# Patient Record
Sex: Female | Born: 1975 | Race: White | Hispanic: No | Marital: Single | Smoking: Former smoker
Health system: Southern US, Community
[De-identification: ages and names within clinical notes are randomized; demographics above are authoritative.]

## PROBLEM LIST (undated history)

## (undated) DIAGNOSIS — E559 Vitamin D deficiency, unspecified: Secondary | ICD-10-CM

## (undated) DIAGNOSIS — M329 Systemic lupus erythematosus, unspecified: Secondary | ICD-10-CM

## (undated) DIAGNOSIS — F32A Depression, unspecified: Secondary | ICD-10-CM

## (undated) DIAGNOSIS — D649 Anemia, unspecified: Secondary | ICD-10-CM

## (undated) DIAGNOSIS — L719 Rosacea, unspecified: Secondary | ICD-10-CM

## (undated) DIAGNOSIS — IMO0002 Reserved for concepts with insufficient information to code with codable children: Secondary | ICD-10-CM

## (undated) DIAGNOSIS — K219 Gastro-esophageal reflux disease without esophagitis: Secondary | ICD-10-CM

## (undated) DIAGNOSIS — M797 Fibromyalgia: Secondary | ICD-10-CM

## (undated) DIAGNOSIS — F419 Anxiety disorder, unspecified: Secondary | ICD-10-CM

## (undated) DIAGNOSIS — G4700 Insomnia, unspecified: Secondary | ICD-10-CM

## (undated) HISTORY — DX: Depression, unspecified: F32.A

## (undated) HISTORY — DX: Fibromyalgia: M79.7

## (undated) HISTORY — DX: Hereditary hemochromatosis: E83.110

## (undated) HISTORY — DX: Systemic lupus erythematosus, unspecified: M32.9

## (undated) HISTORY — DX: Insomnia, unspecified: G47.00

## (undated) HISTORY — DX: Reserved for concepts with insufficient information to code with codable children: IMO0002

## (undated) HISTORY — DX: Rosacea, unspecified: L71.9

## (undated) HISTORY — DX: Vitamin D deficiency, unspecified: E55.9

## (undated) HISTORY — DX: Anemia, unspecified: D64.9

## (undated) HISTORY — DX: Gastro-esophageal reflux disease without esophagitis: K21.9

## (undated) HISTORY — DX: Anxiety disorder, unspecified: F41.9

---
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
## 2006-12-17 HISTORY — PX: CHOLECYSTECTOMY: SHX55

## 2015-12-18 HISTORY — PX: LAPAROSCOPIC GASTRIC SLEEVE RESECTION: SHX5895

## 2016-07-18 DIAGNOSIS — Z9989 Dependence on other enabling machines and devices: Secondary | ICD-10-CM | POA: Insufficient documentation

## 2016-07-18 DIAGNOSIS — G4733 Obstructive sleep apnea (adult) (pediatric): Secondary | ICD-10-CM

## 2016-07-18 HISTORY — DX: Obstructive sleep apnea (adult) (pediatric): G47.33

## 2017-04-22 DIAGNOSIS — M7662 Achilles tendinitis, left leg: Secondary | ICD-10-CM | POA: Insufficient documentation

## 2017-04-22 HISTORY — DX: Achilles tendinitis, left leg: M76.62

## 2020-12-22 ENCOUNTER — Telehealth: Payer: Self-pay | Admitting: Oncology

## 2020-12-22 NOTE — Telephone Encounter (Signed)
Patient referred by Dr Philemon Kingdom for Hereditary Hemochromatosis.  Appt made for 01/09/2021 Labs 9:30 am - Consult 10:00 am

## 2021-01-06 ENCOUNTER — Other Ambulatory Visit: Payer: Self-pay | Admitting: Oncology

## 2021-01-06 DIAGNOSIS — M797 Fibromyalgia: Secondary | ICD-10-CM | POA: Insufficient documentation

## 2021-01-06 DIAGNOSIS — G4700 Insomnia, unspecified: Secondary | ICD-10-CM | POA: Insufficient documentation

## 2021-01-06 DIAGNOSIS — F411 Generalized anxiety disorder: Secondary | ICD-10-CM | POA: Insufficient documentation

## 2021-01-06 DIAGNOSIS — K219 Gastro-esophageal reflux disease without esophagitis: Secondary | ICD-10-CM | POA: Insufficient documentation

## 2021-01-06 DIAGNOSIS — L719 Rosacea, unspecified: Secondary | ICD-10-CM

## 2021-01-06 DIAGNOSIS — E559 Vitamin D deficiency, unspecified: Secondary | ICD-10-CM | POA: Insufficient documentation

## 2021-01-06 DIAGNOSIS — Z903 Acquired absence of stomach [part of]: Secondary | ICD-10-CM

## 2021-01-06 DIAGNOSIS — M329 Systemic lupus erythematosus, unspecified: Secondary | ICD-10-CM

## 2021-01-06 NOTE — Progress Notes (Signed)
[facility]  [address] [place],  [place]  [address] [phone]  Clinic Day:  01/09/2021  Referring physician: Philemon Kingdom, MD  HISTORY OF PRESENT ILLNESS:  The patient is a 45 y.o. female  who I was asked to consult upon for having hemochromatosis.  Recent hemochromatosis testing showed her to have the C282Y and H63D mutations.    According to the patient, she had been feeling weak for the past few months.  Iron parameters were collected as it was thought she may be iron deficient.  However, her iron saturation levels came back elevated at 70.8%.  This led her to undergoing hemochromatosis testing, which revealed both C282Y and H63D mutations.  She denies there being a family history of hemochromatosis.  The patient claims to still have menstrual cycles, but they are very light, which she attributes to having an IUD for 4 years.  She denies having other significant changes in her health over these past few months.  PAST MEDICAL HISTORY:   Past Medical History:  Diagnosis Date  . Anemia   . Anxiety   . Depression   . Fibromyalgia   . GERD (gastroesophageal reflux disease)   . Hereditary hemochromatosis (HCC)   . Insomnia   . Rosacea   . Systemic lupus erythematosus (HCC)   . Vitamin D deficiency   COVID Depression  PAST SURGICAL HISTORY:   Gastric sleeve surgery, dilatation and curettage, cholecystectomy, vulvar ablation  CURRENT MEDICATIONS:   Current Outpatient Medications  Medication Sig Dispense Refill  . ALPRAZolam (XANAX) 0.25 MG tablet Take 0.25 mg by mouth 3 (three) times daily as needed for anxiety.    . DEXILANT 60 MG capsule Take 1 capsule by mouth daily.    . DULoxetine (CYMBALTA) 20 MG capsule Take 20 mg by mouth daily.    Marland Kitchen EPINEPHrine 0.3 mg/0.3 mL IJ SOAJ injection Inject 0.3 mg into the muscle as needed for anaphylaxis.    Marland Kitchen ondansetron (ZOFRAN-ODT) 4 MG disintegrating tablet Take 4 mg by mouth every 8 (eight) hours  as needed.     No current facility-administered medications for this visit.    ALLERGIES:   Allergies  Allergen Reactions  . Fluogen [Influenza Virus Vaccine] Shortness Of Breath and Rash  . Macrobid [Nitrofurantoin] Shortness Of Breath  . Pyridium [Phenazopyridine] Shortness Of Breath  . Naltrexone Hives  . Pylera [Bis Subcit-Metronid-Tetracyc] Itching    FAMILY HISTORY:   Family History  Problem Relation Age of Onset  . Hypertension Mother   . Heart murmur Mother   . Celiac disease Mother   . Hypertension Father   . Heart attack Father   . Transient ischemic attack Father   . Transient ischemic attack Maternal Grandmother   . CVA Paternal Grandmother   . Hypertension Paternal Grandmother   . Heart attack Paternal Grandfather   . Hypertension Paternal Grandfather   . Thyroid disease Sister   . Hypertension Brother    A paternal aunt died from leukemia.  She had 2 maternal uncles died from lung cancer.  SOCIAL HISTORY:  The patient was born and raised in [place].  She lives in the [place] community.  She is divorced, with 4 children and 1 grandchild.  She has been radiology technician for 14 years.  She did smoke a pack of cigarettes daily for 6 years before quitting 20 years ago.  She drinks alcohol on very rare occasions.  REVIEW OF SYSTEMS:  Review of Systems  Constitutional: Negative for  fatigue and fever.  HENT:   Negative for hearing loss and sore throat.   Eyes: Positive for eye problems (suboptimal vision).  Respiratory: Negative for chest tightness, cough and hemoptysis.   Cardiovascular: Negative for chest pain and palpitations.  Gastrointestinal: Negative for abdominal distention, abdominal pain, blood in stool, constipation, diarrhea, nausea and vomiting.  Endocrine: Negative for hot flashes.  Genitourinary: Negative for difficulty urinating, dysuria, frequency, hematuria and nocturia.   Musculoskeletal: Positive for arthralgias and myalgias.  Negative for back pain and gait problem.  Skin: Negative.  Negative for itching and rash.  Neurological: Negative.  Negative for dizziness, extremity weakness, gait problem, headaches, light-headedness and numbness.  Hematological: Negative.   Psychiatric/Behavioral: Positive for depression. Negative for suicidal ideas. The patient is nervous/anxious.      PHYSICAL EXAM:  There were no vitals taken for this visit. Wt Readings from Last 3 Encounters:  01/09/21 176 lb 9.6 oz (80.1 kg)   There is no height or weight on file to calculate BMI.  Vital signs include a weight of 177 lb, temperature 98.1, pulse 65, respirations 16, blood pressure 126/66, O2sat 99% Performance status (ECOG): 0 Physical Exam Constitutional:      Appearance: Normal appearance. She is not ill-appearing.  HENT:     Mouth/Throat:     Mouth: Mucous membranes are moist.     Pharynx: Oropharynx is clear. No oropharyngeal exudate or posterior oropharyngeal erythema.  Cardiovascular:     Rate and Rhythm: Normal rate and regular rhythm.     Heart sounds: No murmur heard. No friction rub. No gallop.   Pulmonary:     Effort: Pulmonary effort is normal. No respiratory distress.     Breath sounds: Normal breath sounds. No wheezing, rhonchi or rales.  Chest:  Breasts:     Right: No axillary adenopathy or supraclavicular adenopathy.     Left: No axillary adenopathy or supraclavicular adenopathy.    Abdominal:     General: Bowel sounds are normal. There is no distension.     Palpations: Abdomen is soft. There is no mass.     Tenderness: There is no abdominal tenderness.  Musculoskeletal:        General: No swelling.     Right lower leg: No edema.     Left lower leg: No edema.  Lymphadenopathy:     Cervical: No cervical adenopathy.     Upper Body:     Right upper body: No supraclavicular or axillary adenopathy.     Left upper body: No supraclavicular or axillary adenopathy.     Lower Body: No right inguinal  adenopathy. No left inguinal adenopathy.  Skin:    General: Skin is warm.     Coloration: Skin is not jaundiced.     Findings: No lesion or rash.  Neurological:     General: No focal deficit present.     Mental Status: She is alert and oriented to person, place, and time. Mental status is at baseline.     Cranial Nerves: Cranial nerves are intact.  Psychiatric:        Mood and Affect: Mood normal.        Behavior: Behavior normal.        Thought Content: Thought content normal.    LABS:   CBC Latest Ref Rng & Units 01/09/2021  WBC - 6.1  Hemoglobin 12.0 - 16.0 13.2  Hematocrit 36 - 46 39  Platelets 150 - 399 280   CMP Latest Ref Rng & Units 01/09/2021  BUN 4 - 21 16  Creatinine 0.5 - 1.1 0.8  Sodium 137 - 147 137  Potassium 3.4 - 5.3 3.8  Chloride 99 - 108 105  CO2 13 - 22 28(A)  Calcium 8.7 - 10.7 8.7  Alkaline Phos 25 - 125 37  AST 13 - 35 37(A)  ALT 7 - 35 40(A)    Ref. Range 01/09/2021 00:00  Iron Unknown 182.0  TIBC Unknown 199  %SAT Unknown 91.4  Ferritin Unknown 67.1    Lab Results  Component Value Date   TIBC 199 01/09/2021   FERRITIN 67.1 01/09/2021   IRONPCTSAT 91.4 01/09/2021    ASSESSMENT & PLAN:  A 45 year old woman who I was asked to consult upon for hemochromatosis (C282Y/H63D).  Based upon her labs today,   although her ferritin is below 100, her other iron parameters are very elevated.  As she does not have 2 C282Y mutations, the likelihood of her having severe iron overload and secondary organ damage in the future is low.  However, I do want to phlebotomize her this week and in 2 months to get her iron parameters to a more suitable level.  I have recommended that her 1st degree relatives also have their iron parameters checked.  If elevated, they also need to screened for hemochromatosis.  She understands her children will at least be carriers.  The question is are they also fully affected to where they may need to be considered for phlebobotomies in  their future.  Otherwise, I will see this patient back in 4 months for repeat clinical assessment.  The patient understands all the plans discussed today and is in agreement with them.  I do appreciate Dr Philemon Kingdom for his new consult.   Rand Boller Kirby Funk, MD

## 2021-01-09 ENCOUNTER — Inpatient Hospital Stay: Payer: Commercial Managed Care - PPO | Attending: Oncology | Admitting: Hematology and Oncology

## 2021-01-09 ENCOUNTER — Other Ambulatory Visit: Payer: Self-pay

## 2021-01-09 ENCOUNTER — Inpatient Hospital Stay (INDEPENDENT_AMBULATORY_CARE_PROVIDER_SITE_OTHER): Payer: Commercial Managed Care - PPO | Admitting: Oncology

## 2021-01-09 ENCOUNTER — Other Ambulatory Visit: Payer: Self-pay | Admitting: Oncology

## 2021-01-09 ENCOUNTER — Telehealth: Payer: Self-pay | Admitting: Oncology

## 2021-01-09 ENCOUNTER — Other Ambulatory Visit: Payer: Self-pay | Admitting: Hematology and Oncology

## 2021-01-09 LAB — BASIC METABOLIC PANEL
BUN: 16 (ref 4–21)
CO2: 28 — AB (ref 13–22)
Chloride: 105 (ref 99–108)
Creatinine: 0.8 (ref 0.5–1.1)
Glucose: 97
Potassium: 3.8 (ref 3.4–5.3)
Sodium: 137 (ref 137–147)

## 2021-01-09 LAB — CBC AND DIFFERENTIAL
HCT: 39 (ref 36–46)
Hemoglobin: 13.2 (ref 12.0–16.0)
Neutrophils Absolute: 3.66
Platelets: 280 (ref 150–399)
WBC: 6.1

## 2021-01-09 LAB — HEPATIC FUNCTION PANEL
ALT: 40 — AB (ref 7–35)
AST: 37 — AB (ref 13–35)
Alkaline Phosphatase: 37 (ref 25–125)
Bilirubin, Total: 0.6

## 2021-01-09 LAB — COMPREHENSIVE METABOLIC PANEL
Albumin: 3.6 (ref 3.5–5.0)
Calcium: 8.7 (ref 8.7–10.7)

## 2021-01-09 LAB — CBC: RBC: 4.03 (ref 3.87–5.11)

## 2021-01-09 LAB — IRON,TIBC AND FERRITIN PANEL
%SAT: 91.4
Ferritin: 67.1
Iron: 182
TIBC: 199

## 2021-01-09 NOTE — Telephone Encounter (Signed)
Per 1/24 LOS, patient scheduled for May Appt's.  Patient entered her Appt's in her phone

## 2021-01-10 ENCOUNTER — Encounter: Payer: Self-pay | Admitting: Oncology

## 2021-01-10 ENCOUNTER — Telehealth: Payer: Self-pay | Admitting: Oncology

## 2021-01-10 NOTE — Telephone Encounter (Signed)
Patient scheduled for Jan, Mar, May Phlebotomy Therapy's - Also scheduled for May Labs, Follow Up  Gave Appt's over the phone

## 2021-01-10 NOTE — Progress Notes (Unsigned)
Per [place] @ [facility] [phone] no PA required for Phlebotomy [address]  Ref#Vivien B 01/10/2021 3;01 PM

## 2021-01-13 ENCOUNTER — Inpatient Hospital Stay: Payer: Commercial Managed Care - PPO

## 2021-01-13 ENCOUNTER — Other Ambulatory Visit: Payer: Self-pay

## 2021-01-13 NOTE — Progress Notes (Signed)
Casimiro Needle presents today for phlebotomy per MD orders. Phlebotomy procedure started at 1549 and ended at 1603. 485 grams removed. Patient observed for 30 minutes after procedure without any incident. Patient tolerated procedure well. IV needle removed intact. Pt drank po water and ginger ale.  No strenuous activities advised.

## 2021-01-13 NOTE — Patient Instructions (Signed)
Therapeutic Phlebotomy Therapeutic phlebotomy is the planned removal of blood from a person's body for the purpose of treating a medical condition. The procedure is similar to donating blood. Usually, about a pint (470 mL, or 0.47 L) of blood is removed. The average adult has 9-12 pints (4.3-5.7 L) of blood in the body. Therapeutic phlebotomy may be used to treat the following medical conditions:  Hemochromatosis. This is a condition in which the blood contains too much iron.  Polycythemia vera. This is a condition in which the blood contains too many red blood cells.  Porphyria cutanea tarda. This is a disease in which an important part of hemoglobin is not made properly. It results in the buildup of abnormal amounts of porphyrins in the body.  Sickle cell disease. This is a condition in which the red blood cells form an abnormal crescent shape rather than a round shape. Tell a health care provider about:  Any allergies you have.  All medicines you are taking, including vitamins, herbs, eye drops, creams, and over-the-counter medicines.  Any problems you or family members have had with anesthetic medicines.  Any blood disorders you have.  Any surgeries you have had.  Any medical conditions you have.  Whether you are pregnant or may be pregnant. What are the risks? Generally, this is a safe procedure. However, problems may occur, including:  Nausea or light-headedness.  Low blood pressure (hypotension).  Soreness, bleeding, swelling, or bruising at the needle insertion site.  Infection. What happens before the procedure?  Follow instructions from your health care provider about eating or drinking restrictions.  Ask your health care provider about: ? Changing or stopping your regular medicines. This is especially important if you are taking diabetes medicines or blood thinners (anticoagulants). ? Taking medicines such as aspirin and ibuprofen. These medicines can thin your  blood. Do not take these medicines unless your health care provider tells you to take them. ? Taking over-the-counter medicines, vitamins, herbs, and supplements.  Wear clothing with sleeves that can be raised above the elbow.  Plan to have someone take you home from the hospital or clinic.  You may have a blood sample taken.  Your blood pressure, pulse rate, and breathing rate will be measured. What happens during the procedure?  To lower your risk of infection: ? Your health care team will wash or sanitize their hands. ? Your skin will be cleaned with an antiseptic.  You may be given a medicine to numb the area (local anesthetic).  A tourniquet will be placed on your arm.  A needle will be inserted into one of your veins.  Tubing and a collection bag will be attached to that needle.  Blood will flow through the needle and tubing into the collection bag.  The collection bag will be placed lower than your arm to allow gravity to help the flow of blood into the bag.  You may be asked to open and close your hand slowly and continually during the entire collection.  After the specified amount of blood has been removed from your body, the collection bag and tubing will be clamped.  The needle will be removed from your vein.  Pressure will be held on the site of the needle insertion to stop the bleeding.  A bandage (dressing) will be placed over the needle insertion site. The procedure may vary among health care providers and hospitals.   What happens after the procedure?  Your blood pressure, pulse rate, and breathing rate will   be measured after the procedure.  You will be encouraged to drink fluids.  Your recovery will be assessed and monitored.  You can return to your normal activities as told by your health care provider. Summary  Therapeutic phlebotomy is the planned removal of blood from a person's body for the purpose of treating a medical condition.  Therapeutic  phlebotomy may be used to treat hemochromatosis, polycythemia vera, porphyria cutanea tarda, or sickle cell disease.  In the procedure, a needle is inserted and about a pint (470 mL, or 0.47 L) of blood is removed. The average adult has 9-12 pints (4.3-5.7 L) of blood in the body.  This is generally a safe procedure, but it can sometimes cause problems such as nausea, light-headedness, or low blood pressure (hypotension). This information is not intended to replace advice given to you by your health care provider. Make sure you discuss any questions you have with your health care provider. Document Revised: 12/19/2017 Document Reviewed: 12/19/2017 Elsevier Patient Education  2021 Elsevier Inc.  

## 2021-03-13 ENCOUNTER — Inpatient Hospital Stay: Payer: Commercial Managed Care - PPO

## 2021-03-13 ENCOUNTER — Telehealth: Payer: Self-pay

## 2021-03-13 NOTE — Telephone Encounter (Signed)
PT WILL BE GIVING BLOOD THRU [facility], DOES NOT WANT A PHLEBOTOMY TODAY.

## 2021-05-05 NOTE — Progress Notes (Signed)
[facility]  [address] [place],  [place]  [address] [phone]  Clinic Day:  05/09/2021  Referring physician: Philemon Kingdom, MD  This document serves as a record of services personally performed by Wendy Kirby Funk, MD. It was created on their behalf by Wendy Spencer, a trained medical scribe. The creation of this record is based on the scribe's personal observations and the provider's statements to them.  HISTORY OF PRESENT ILLNESS:  The patient is a 45 y.o. 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   She comes in today to reassess her iron parameters after being phlebotomized every 2 months over these past handful of months.  Since her last visit, she has been doing well.  She denies having any systemic symptoms which concern her for complications related to her underlying hemochromatosis.  PHYSICAL EXAM:  Blood pressure 112/72, pulse 62, temperature 98.4 F (36.9 C), resp. rate 16, height 5\' 4"  (1.626 m), weight 179 lb 1.6 oz (81.2 kg), SpO2 99 %. Wt Readings from Last 3 Encounters:  05/09/21 179 lb 1.6 oz (81.2 kg)  01/13/21 177 lb 8 oz (80.5 kg)  01/09/21 176 lb 9.6 oz (80.1 kg)   Body mass index is 30.74 kg/m.   Performance status (ECOG): 0 Physical Exam Constitutional:      Appearance: Normal appearance. She is not ill-appearing.  HENT:     Mouth/Throat:     Mouth: Mucous membranes are moist.     Pharynx: Oropharynx is clear. No oropharyngeal exudate or posterior oropharyngeal erythema.  Cardiovascular:     Rate and Rhythm: Normal rate and regular rhythm.     Heart sounds: No murmur heard. No friction rub. No gallop.   Pulmonary:     Effort: Pulmonary effort is normal. No respiratory distress.     Breath sounds: Normal breath sounds. No wheezing, rhonchi or rales.  Chest:  Breasts:     Right: No axillary adenopathy or supraclavicular adenopathy.     Left: No axillary adenopathy or supraclavicular adenopathy.     Abdominal:     General: Bowel sounds are normal. There is no distension.     Palpations: Abdomen is soft. There is no mass.     Tenderness: There is no abdominal tenderness.  Musculoskeletal:        General: No swelling.     Right lower leg: No edema.     Left lower leg: No edema.  Lymphadenopathy:     Cervical: No cervical adenopathy.     Upper Body:     Right upper body: No supraclavicular or axillary adenopathy.     Left upper body: No supraclavicular or axillary adenopathy.     Lower Body: No right inguinal adenopathy. No left inguinal adenopathy.  Skin:    General: Skin is warm.     Coloration: Skin is not jaundiced.     Findings: No lesion or rash.  Neurological:     General: No focal deficit present.     Mental Status: She is alert and oriented to person, place, and time. Mental status is at baseline.     Cranial Nerves: Cranial nerves are intact.  Psychiatric:        Mood and Affect: Mood normal.        Behavior: Behavior normal.        Thought Content: Thought content normal.    LABS:   CBC Latest Ref Rng & Units 05/08/2021 01/09/2021  WBC - 7.9 6.1  Hemoglobin 12.0 - 16.0 13.4  13.2  Hematocrit 36 - 46 41 39  Platelets 150 - 399 262 280   CMP Latest Ref Rng & Units 05/08/2021 01/09/2021  BUN 4 - 21 16 16   Creatinine 0.5 - 1.1 0.8 0.8  Sodium 137 - 147 136(A) 137  Potassium 3.4 - 5.3 3.7 3.8  Chloride 99 - 108 106 105  CO2 13 - 22 26(A) 28(A)  Calcium 8.7 - 10.7 8.6(A) 8.7  Alkaline Phos 25 - 125 39 37  AST 13 - 35 30 37(A)  ALT 7 - 35 34 40(A)    Ref. Range 01/09/2021 00:00 05/08/2021 8:47  Iron Unknown 182.0 130  TIBC Unknown 199 288  %SAT Unknown 91.4 45  Ferritin Unknown 67.1 26    ASSESSMENT & PLAN:  A 45 year old woman with hemochromatosis (C282Y/H63D).  Based upon her labs today, I am pleased as her ferritin is well below 100.  Her other iron parameters have also improved.   Based upon her current levels, she does not need to be phlebotomized  for these next few months.  I will see this patient back in 4 months for repeat clinical assessment.  The patient understands all the plans discussed today and is in agreement with them.   I, 48, am acting as scribe for Wendy Deer, MD    I have reviewed this report as typed by the medical scribe, and it is complete and accurate.  Wendy Weston Settle, MD

## 2021-05-08 ENCOUNTER — Other Ambulatory Visit: Payer: Self-pay

## 2021-05-08 ENCOUNTER — Encounter: Payer: Self-pay | Admitting: Hematology and Oncology

## 2021-05-08 ENCOUNTER — Inpatient Hospital Stay: Payer: Commercial Managed Care - PPO | Attending: Oncology

## 2021-05-08 LAB — CBC AND DIFFERENTIAL
HCT: 41 (ref 36–46)
Hemoglobin: 13.4 (ref 12.0–16.0)
Neutrophils Absolute: 5.29
Platelets: 262 (ref 150–399)
WBC: 7.9

## 2021-05-08 LAB — HEPATIC FUNCTION PANEL
ALT: 34 (ref 7–35)
AST: 30 (ref 13–35)
Alkaline Phosphatase: 39 (ref 25–125)
Bilirubin, Total: 0.5

## 2021-05-08 LAB — BASIC METABOLIC PANEL
BUN: 16 (ref 4–21)
CO2: 26 — AB (ref 13–22)
Chloride: 106 (ref 99–108)
Creatinine: 0.8 (ref 0.5–1.1)
Glucose: 102
Potassium: 3.7 (ref 3.4–5.3)
Sodium: 136 — AB (ref 137–147)

## 2021-05-08 LAB — IRON AND TIBC
Iron: 130 ug/dL (ref 28–170)
Saturation Ratios: 45 % — ABNORMAL HIGH (ref 10.4–31.8)
TIBC: 288 ug/dL (ref 250–450)
UIBC: 158 ug/dL

## 2021-05-08 LAB — COMPREHENSIVE METABOLIC PANEL
Albumin: 3.6 (ref 3.5–5.0)
Calcium: 8.6 — AB (ref 8.7–10.7)

## 2021-05-08 LAB — CBC: RBC: 4.24 (ref 3.87–5.11)

## 2021-05-08 LAB — FERRITIN: Ferritin: 26 ng/mL (ref 11–307)

## 2021-05-09 ENCOUNTER — Inpatient Hospital Stay (INDEPENDENT_AMBULATORY_CARE_PROVIDER_SITE_OTHER): Payer: Commercial Managed Care - PPO | Admitting: Oncology

## 2021-05-09 ENCOUNTER — Other Ambulatory Visit: Payer: Self-pay | Admitting: Oncology

## 2021-08-05 DIAGNOSIS — L239 Allergic contact dermatitis, unspecified cause: Secondary | ICD-10-CM

## 2021-08-05 HISTORY — DX: Allergic contact dermatitis, unspecified cause: L23.9

## 2021-08-18 ENCOUNTER — Encounter: Payer: Self-pay | Admitting: Oncology

## 2021-08-18 NOTE — Progress Notes (Signed)
[facility]  [address] [place],  [place]  [address] [phone]  Clinic Day:  08/28/2021  Referring physician: Philemon Kingdom, MD  This document serves as a record of services personally performed by Dequincy Kirby Funk, MD. It was created on their behalf by [facility], a trained medical scribe. The creation of this record is based on the scribe's personal observations and the provider's statements to them.  HISTORY OF PRESENT ILLNESS:  The patient is a 45 y.o. 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   She comes in today to reassess her iron parameters.  Since her last visit, she has been doing well.  She denies having any systemic symptoms which concern her for complications related to her underlying hemochromatosis.  PHYSICAL EXAM:  Blood pressure 100/65, pulse 84, temperature 98.5 F (36.9 C), resp. rate 16, height 5\' 4"  (1.626 m), weight 169 lb (76.7 kg), SpO2 99 %. Wt Readings from Last 3 Encounters:  08/28/21 169 lb (76.7 kg)  05/09/21 179 lb 1.6 oz (81.2 kg)  01/13/21 177 lb 8 oz (80.5 kg)   Body mass index is 29.01 kg/m.   Performance status (ECOG): 0 Physical Exam Constitutional:      Appearance: Normal appearance. She is not ill-appearing.  HENT:     Mouth/Throat:     Mouth: Mucous membranes are moist.     Pharynx: Oropharynx is clear. No oropharyngeal exudate or posterior oropharyngeal erythema.  Cardiovascular:     Rate and Rhythm: Normal rate and regular rhythm.     Heart sounds: No murmur heard.   No friction rub. No gallop.  Pulmonary:     Effort: Pulmonary effort is normal. No respiratory distress.     Breath sounds: Normal breath sounds. No wheezing, rhonchi or rales.  Abdominal:     General: Bowel sounds are normal. There is no distension.     Palpations: Abdomen is soft. There is no mass.     Tenderness: There is no abdominal tenderness.  Musculoskeletal:        General: No swelling.     Right lower leg: No  edema.     Left lower leg: No edema.  Lymphadenopathy:     Cervical: No cervical adenopathy.     Upper Body:     Right upper body: No supraclavicular or axillary adenopathy.     Left upper body: No supraclavicular or axillary adenopathy.     Lower Body: No right inguinal adenopathy. No left inguinal adenopathy.  Skin:    General: Skin is warm.     Coloration: Skin is not jaundiced.     Findings: No lesion or rash.  Neurological:     General: No focal deficit present.     Mental Status: She is alert and oriented to person, place, and time. Mental status is at baseline.     Cranial Nerves: Cranial nerves are intact.  Psychiatric:        Mood and Affect: Mood normal.        Behavior: Behavior normal.        Thought Content: Thought content normal.   LABS:   CBC Latest Ref Rng & Units 08/25/2021 05/08/2021 01/09/2021  WBC - 4.8 7.9 6.1  Hemoglobin 12.0 - 16.0 14.0 13.4 13.2  Hematocrit 36 - 46 42 41 39  Platelets 150 - 399 244 262 280   CMP Latest Ref Rng & Units 05/08/2021 01/09/2021  BUN 4 - 21 16 16   Creatinine 0.5 - 1.1 0.8 0.8  Sodium 137 - 147 136(A) 137  Potassium 3.4 - 5.3 3.7 3.8  Chloride 99 - 108 106 105  CO2 13 - 22 26(A) 28(A)  Calcium 8.7 - 10.7 8.6(A) 8.7  Alkaline Phos 25 - 125 39 37  AST 13 - 35 30 37(A)  ALT 7 - 35 34 40(A)    Ref. Range 01/09/2021 00:00 05/08/2021 8:47 08/25/2021 9:21  Iron Unknown 182.0 130 171  TIBC Unknown 199 288 248  %SAT Unknown 91.4 45 69  Ferritin Unknown 67.1 26 37    ASSESSMENT & PLAN:  A 45 year old woman with hemochromatosis (C282Y/H63D).  Based upon her labs today, although her ferritin is below 50, her other iron parameters are clearly rising.  Based upon this, I will arrange for her to be phlebotomized this week.  Clinically she is doing well.  I will see this patient back in 6 months for repeat clinical assessment.  The patient understands all the plans discussed today and is in agreement with them.   I, Foye Deer, am  acting as scribe for Weston Settle, MD    I have reviewed this report as typed by the medical scribe, and it is complete and accurate.  Dequincy Kirby Funk, MD

## 2021-08-24 ENCOUNTER — Encounter: Payer: Self-pay | Admitting: Oncology

## 2021-08-25 ENCOUNTER — Other Ambulatory Visit: Payer: Self-pay | Admitting: Hematology and Oncology

## 2021-08-25 ENCOUNTER — Other Ambulatory Visit: Payer: Self-pay

## 2021-08-25 ENCOUNTER — Inpatient Hospital Stay: Payer: 59 | Attending: Oncology

## 2021-08-25 LAB — IRON AND TIBC
Iron: 171 ug/dL — ABNORMAL HIGH (ref 28–170)
Saturation Ratios: 69 % — ABNORMAL HIGH (ref 10.4–31.8)
TIBC: 248 ug/dL — ABNORMAL LOW (ref 250–450)
UIBC: 77 ug/dL

## 2021-08-25 LAB — CBC AND DIFFERENTIAL
HCT: 42 (ref 36–46)
Hemoglobin: 14 (ref 12.0–16.0)
Neutrophils Absolute: 2.59
Platelets: 244 (ref 150–399)
WBC: 4.8

## 2021-08-25 LAB — CBC: RBC: 4.35 (ref 3.87–5.11)

## 2021-08-25 LAB — FERRITIN: Ferritin: 37 ng/mL (ref 11–307)

## 2021-08-28 ENCOUNTER — Telehealth: Payer: Self-pay | Admitting: Oncology

## 2021-08-28 ENCOUNTER — Inpatient Hospital Stay (INDEPENDENT_AMBULATORY_CARE_PROVIDER_SITE_OTHER): Payer: 59 | Admitting: Oncology

## 2021-08-28 ENCOUNTER — Other Ambulatory Visit: Payer: Self-pay

## 2021-08-28 NOTE — Telephone Encounter (Signed)
Per 9/12  LOS next appt scheduled and given to patient 

## 2021-09-18 ENCOUNTER — Encounter: Payer: Self-pay | Admitting: Oncology

## 2022-03-09 ENCOUNTER — Other Ambulatory Visit: Payer: 59

## 2022-03-12 ENCOUNTER — Ambulatory Visit: Payer: 59 | Admitting: Oncology

## 2022-04-12 ENCOUNTER — Other Ambulatory Visit: Payer: Self-pay | Admitting: Neurosurgery

## 2022-04-12 DIAGNOSIS — I672 Cerebral atherosclerosis: Secondary | ICD-10-CM

## 2022-04-23 ENCOUNTER — Ambulatory Visit
Admission: RE | Admit: 2022-04-23 | Discharge: 2022-04-23 | Disposition: A | Discharge status: Home / Self Care | Payer: 59 | Source: Ambulatory Visit | Attending: Neurosurgery | Admitting: Neurosurgery

## 2022-04-23 DIAGNOSIS — I672 Cerebral atherosclerosis: Secondary | ICD-10-CM

## 2022-06-06 ENCOUNTER — Ambulatory Visit: Payer: 59 | Admitting: Physical Therapy

## 2022-06-12 ENCOUNTER — Ambulatory Visit: Payer: 59 | Attending: Neurosurgery | Admitting: Physical Therapy

## 2022-06-12 DIAGNOSIS — G8929 Other chronic pain: Secondary | ICD-10-CM | POA: Insufficient documentation

## 2022-06-12 DIAGNOSIS — R252 Cramp and spasm: Secondary | ICD-10-CM | POA: Insufficient documentation

## 2022-06-12 DIAGNOSIS — M5442 Lumbago with sciatica, left side: Secondary | ICD-10-CM | POA: Insufficient documentation

## 2022-06-12 DIAGNOSIS — M6281 Muscle weakness (generalized): Secondary | ICD-10-CM | POA: Diagnosis present

## 2022-06-27 ENCOUNTER — Ambulatory Visit: Payer: 59 | Attending: Neurosurgery

## 2022-06-27 DIAGNOSIS — M6281 Muscle weakness (generalized): Secondary | ICD-10-CM | POA: Insufficient documentation

## 2022-06-27 DIAGNOSIS — M5442 Lumbago with sciatica, left side: Secondary | ICD-10-CM | POA: Insufficient documentation

## 2022-06-27 DIAGNOSIS — R252 Cramp and spasm: Secondary | ICD-10-CM | POA: Insufficient documentation

## 2022-06-27 DIAGNOSIS — G8929 Other chronic pain: Secondary | ICD-10-CM | POA: Diagnosis present

## 2022-06-27 NOTE — Therapy (Signed)
OUTPATIENT PHYSICAL THERAPY TREATMENT   Patient Name: Wendy Spencer MRN: [id number] DOB:[birth year], 46 y.o., female Today's Date: 06/27/2022     Past Medical History:  Diagnosis Date   Anemia    Anxiety    Depression    Fibromyalgia    GERD (gastroesophageal reflux disease)    Hereditary hemochromatosis ([place])    Insomnia    Rosacea    Systemic lupus erythematosus ([place])    Vitamin D deficiency     Patient Active Problem List   Diagnosis Date Noted   Hereditary hemochromatosis ([place]) 01/06/2021   Systemic lupus erythematosus ([place]) 01/06/2021   Fibromyalgia 01/06/2021   GERD (gastroesophageal reflux disease) 01/06/2021   Vitamin D deficiency 01/06/2021   Rosacea 01/06/2021   Anxiety state 01/06/2021   Insomnia 01/06/2021   H/O gastric sleeve 01/06/2021   Obstructive sleep apnea on CPAP 07/18/2016    PCP: Lydia Guiles MD  REFERRING PROVIDER: Earnie Larsson, MD  REFERRING DIAG: M54.16 (ICD-10-CM) - Radiculopathy, lumbar region  Rationale for Evaluation and Treatment Rehabilitation  THERAPY DIAG:  Chronic bilateral low back pain with left-sided sciatica  Cramp and spasm  Muscle weakness (generalized)  ONSET DATE: chronic LBP for 7 years, worsened last year.   SUBJECTIVE:                                                                                                                                                                                           SUBJECTIVE STATEMENT: Feeling a little pain, keeping up with HEP pretty well.  PERTINENT HISTORY:  hemochromatosis, lupus, h/o gastric sleeve, fibromyalgia, GERD  PAIN:  Are you having pain? Yes: NPRS scale: 3/10 Pain location: low back  intermittent radiates down R buttock, and down to L foot Pain description: dull aching Aggravating factors: prolonged sitting > 15 min or standing > 15 min  Relieving factors: changing positions, stretches, hot water   PRECAUTIONS: None  WEIGHT BEARING  RESTRICTIONS No  FALLS:  Has patient fallen in last 6 months? 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  Reports not available for review.   PATIENT SURVEYS:  Modified Oswestry 14/50 = 28% disability (moderate)   SCREENING FOR RED FLAGS: Bowel or bladder incontinence: No   COGNITION:  Overall cognitive status: Within functional limits for tasks assessed  SENSATION: WFL  MUSCLE LENGTH: Hamstrings: Right ~ 90 deg; Left ~90 deg Ely's test: Right 125 deg; Left 110 deg  POSTURE: No Significant postural limitations  PALPATION: Tenderness with PA mobs from L1-L5, R SIJ, bil piriformis and glute med.   LUMBAR ROM:   Active  A/PROM  eval  Flexion To knees* pulling/pain  Extension 25% limited, inc pain  Right lateral flexion To knee, not as painful as L, stiff  Left lateral flexion To knee, pain, stiff   Right rotation Inc pain   Left rotation Inc pain   (Blank rows = not tested)  LOWER EXTREMITY ROM:   good hip hip mobility, pain free, WNL, symmetric   LOWER EXTREMITY MMT:    MMT Right eval Left eval  Hip flexion 4+ 4+  Hip extension 4+* 4+*  Hip abduction 5 5  Hip adduction 5 5  Knee flexion    Knee extension 5 5  Ankle dorsiflexion 5 5   (Blank rows = not tested)  * pain  LUMBAR SPECIAL TESTS:  Straight leg raise test: neg but increased pain on L at 90 deg, Quadrant test: Positive, FABER test: Negative, and Long sit test: Negative  GAIT: Distance walked: 50 Assistive device utilized: None Level of assistance: Complete Independence Comments: no device or deviation.    TODAY'S TREATMENT  06/27/22 Therapeutic  Exercise: Bike L2x35min Supine pelvic tilt 10x LTR 10x Supine hip ER/IR 10x Supine Bridge 10x Supine march with GTB 10x Supine clam with GTB 10x Quadruped arm raises 10x Quadruped glute kicks 10x Manual Therapy: STM to R glute med, piriformis 6/27/2023Therapeutic Exercise: to improve strength and mobility.  Demo, verbal and tactile cues throughout for technique.   - Prone x 3 min  - Prone Knee Flexion AROM 10 reps bil - Supine Posterior Pelvic Tilt   10 reps - Supine Hip Internal and External Rotation - 10 reps   PATIENT EDUCATION:  Education details: findings, POC, initial HEP Person educated: Patient Education method: Explanation, Demonstration, Verbal cues, and Handouts Education comprehension: verbalized understanding and returned demonstration   HOME EXERCISE PROGRAM: Access Code: [email or web address]  ASSESSMENT:  CLINICAL IMPRESSION:   Pt presented today with mild LBP. She was able to complete all exercises w/o increased pain. Provided cues to stabilize core throughout session and progressed HEP. Finished session with MT to decrease stiffness and pain in lower back   OBJECTIVE IMPAIRMENTS decreased ROM, decreased strength, increased fascial restrictions, increased muscle spasms, improper body mechanics, and pain.   ACTIVITY LIMITATIONS carrying, lifting, bending, sitting, standing, and stairs  PARTICIPATION LIMITATIONS: meal prep, cleaning, laundry, and occupation  PERSONAL FACTORS Time since onset of injury/illness/exacerbation and 1-2 comorbidities: lupus, fibromyalgia  are also affecting patient's functional outcome.   REHAB POTENTIAL: Good  CLINICAL DECISION MAKING: Stable/uncomplicated  EVALUATION COMPLEXITY: Low   GOALS: Goals reviewed with patient? Yes  SHORT TERM GOALS: Target date: 06/26/2022   Patient will be independent with initial HEP.  Baseline: given Goal status: INITIAL  LONG TERM GOALS: Target date: 07/24/2022   Patient will be independent with  advanced/ongoing HEP to improve outcomes and carryover.  Baseline: needs progression Goal status: INITIAL  2.  Patient will report 75% improvement in low back pain to improve QOL.  Baseline: 2-3/10 with intermittent radicular symptoms.  Goal status: INITIAL  3.  Patient will demonstrate full pain free lumbar ROM to perform ADLs.   Baseline: see objective Goal status: INITIAL  4.  Patient will report <20% disability on modified Oswestry to demonstrate  improved functional ability.  Baseline: 28% (moderate) disability  Goal status: INITIAL   5.  Patient will tolerate 45 min of standing or sitting without increased LBP.  Baseline: <15 minutes before needs to change position Goal status: INITIAL  6.  Patient will be able to return to regular gym based program. (Weights and cardio) Baseline: stopped due to LB pain Goal status: INITIAL   PLAN: PT FREQUENCY: 1-2x/week  PT DURATION: 6 weeks  PLANNED INTERVENTIONS: Therapeutic exercises, Therapeutic activity, Neuromuscular re-education, Balance training, Gait training, Patient/Family education, Joint mobilization, Stair training, Dry Needling, Wheelchair mobility training, Spinal mobilization, Cryotherapy, Moist heat, Traction, Ultrasound, and Manual therapy.  PLAN FOR NEXT SESSION: review and progress HEP - neutral spine exercises but try to progress extension also, dry needling/manual therapy, modalities PRN   Darleene Cleaver, PTA 06/27/2022, 3:33 PM

## 2022-07-02 ENCOUNTER — Ambulatory Visit: Payer: 59 | Admitting: Physical Therapy

## 2022-07-02 ENCOUNTER — Encounter: Payer: Self-pay | Admitting: Physical Therapy

## 2022-07-02 DIAGNOSIS — M5442 Lumbago with sciatica, left side: Secondary | ICD-10-CM | POA: Diagnosis not present

## 2022-07-02 DIAGNOSIS — M6281 Muscle weakness (generalized): Secondary | ICD-10-CM

## 2022-07-02 DIAGNOSIS — R252 Cramp and spasm: Secondary | ICD-10-CM

## 2022-07-02 DIAGNOSIS — G8929 Other chronic pain: Secondary | ICD-10-CM

## 2022-07-02 NOTE — Therapy (Signed)
OUTPATIENT PHYSICAL THERAPY TREATMENT   Patient Name: Wendy Spencer MRN: [id number] DOB:1976-01-04, 46 y.o., female Today's Date: 07/02/2022   PT End of Session - 07/02/22 0806     Visit Number 3    PT Start Time 0803    PT Stop Time 2595    PT Time Calculation (min) 54 min    Activity Tolerance Patient tolerated treatment well    Behavior During Therapy [facility] for tasks assessed/performed              Past Medical History:  Diagnosis Date   Anemia    Anxiety    Depression    Fibromyalgia    GERD (gastroesophageal reflux disease)    Hereditary hemochromatosis ([place])    Insomnia    Rosacea    Systemic lupus erythematosus ([place])    Vitamin D deficiency     Patient Active Problem List   Diagnosis Date Noted   Hereditary hemochromatosis ([place]) 01/06/2021   Systemic lupus erythematosus ([place]) 01/06/2021   Fibromyalgia 01/06/2021   GERD (gastroesophageal reflux disease) 01/06/2021   Vitamin D deficiency 01/06/2021   Rosacea 01/06/2021   Anxiety state 01/06/2021   Insomnia 01/06/2021   H/O gastric sleeve 01/06/2021   Obstructive sleep apnea on CPAP 07/18/2016    PCP: Lydia Guiles MD  REFERRING PROVIDER: Earnie Larsson, MD  REFERRING DIAG: M54.16 (ICD-10-CM) - Radiculopathy, lumbar region  Rationale for Evaluation and Treatment Rehabilitation  THERAPY DIAG:  Chronic bilateral low back pain with left-sided sciatica  Cramp and spasm  Muscle weakness (generalized)  ONSET DATE: chronic LBP for 7 years, worsened last year.   SUBJECTIVE:                                                                                                                                                                                           SUBJECTIVE STATEMENT: Pt. Reports back is about the same.   Gets sharp pain when trying to engage core.  No pain radiating down to foot since starting PT.    PERTINENT HISTORY:  hemochromatosis, lupus, h/o gastric sleeve, fibromyalgia,  GERD  PAIN:  Are you having pain? Yes: NPRS scale: 3/10 Pain location: low back  intermittent radiates down R buttock, and down to L foot, intermittent 7/10 sharp Pain description: dull aching Aggravating factors: prolonged sitting > 15 min or standing > 15 min  Relieving factors: changing positions, stretches, hot water   PRECAUTIONS: None  WEIGHT BEARING RESTRICTIONS No  FALLS:  Has patient fallen in last 6 months? 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  Reports not available for review.   PATIENT SURVEYS:  Modified Oswestry 14/50 = 28% disability (moderate)   SCREENING FOR RED FLAGS: Bowel or bladder incontinence: No   COGNITION:  Overall cognitive status: Within functional limits for tasks assessed     SENSATION: WFL  MUSCLE LENGTH: Hamstrings: Right ~ 90 deg; Left ~90 deg Ely's test: Right 125 deg; Left 110 deg  POSTURE: No Significant postural limitations  PALPATION: Tenderness with PA mobs from L1-L5, R SIJ, bil piriformis and glute med.   LUMBAR ROM:   Active  A/PROM  eval  Flexion To knees* pulling/pain  Extension 25% limited, inc pain  Right lateral flexion To knee, not as painful as L, stiff  Left lateral flexion To knee, pain, stiff   Right rotation Inc pain   Left rotation Inc pain   (Blank rows = not tested)  LOWER EXTREMITY ROM:   good hip hip mobility, pain free, WNL, symmetric   LOWER EXTREMITY MMT:    MMT Right eval Left eval  Hip flexion 4+ 4+  Hip extension 4+* 4+*  Hip abduction 5 5  Hip adduction 5 5  Knee flexion    Knee extension 5 5  Ankle dorsiflexion 5 5   (Blank rows = not  tested)  * pain  LUMBAR SPECIAL TESTS:  Straight leg raise test: neg but increased pain on L at 90 deg, Quadrant test: Positive, FABER test: Negative, and Long sit test: Negative  GAIT: Distance walked: 50 Assistive device utilized: None Level of assistance: Complete Independence Comments: no device or deviation.    TODAY'S TREATMENT  07/02/2022 Therapeutic Exercise: to improve strength and mobility.  Demo, verbal and tactile cues throughout for technique. Bike x 6 min Side glides at wall x 10 L side facing wall Standing back extension x 10 (at counter) Supine: LTR x 10 SKTC stretch x 5 bil  PPT x 10  Bridge 2 x 10 with PPT Supine clam RTB 2 x 10 with PPT S/l clams 2 x 10 bil RTB  Manual Therapy: to decrease muscle spasm and pain and improve mobility.  STM/TPR to R lumbar paraspinals, glut med and piriformis, R UPA mobs L4-5 grade 1-2, skilled palpation and monitoring during dry needling. Trigger Point Dry-Needling  Treatment instructions: Expect mild to moderate muscle soreness. S/S of pneumothorax if dry needled over a lung field, and to seek immediate medical attention should they occur. Patient verbalized understanding of these instructions and education.  Patient Consent Given: Yes Education handout provided: Yes Muscles treated: R L5 multifidi, R glut med, R piriformis Treatment response/outcome: Twitch Response Elicited and Palpable Increase in Muscle Length    06/27/22 Therapeutic Exercise: Bike L2x33min Supine pelvic tilt 10x LTR 10x Supine hip ER/IR 10x Supine Bridge 10x Supine march with GTB 10x Supine clam with GTB 10x Quadruped arm raises 10x Quadruped glute kicks 10x Manual Therapy: STM to R glute med, piriformis  6/27/2023Therapeutic Exercise: to improve strength and mobility.  Demo, verbal and tactile cues throughout for technique.   - Prone x 3 min  - Prone Knee Flexion AROM 10 reps bil - Supine Posterior Pelvic Tilt   10 reps - Supine Hip Internal  and External Rotation - 10 reps  PATIENT EDUCATION:  Education details: dry needling Person educated: Patient Education method: Theatre stage manager Education comprehension: verbalized understanding   HOME EXERCISE PROGRAM: Access Code: [email or web address]  ASSESSMENT:  CLINICAL IMPRESSION:  Pt reports no radicular pain since starting PT, but continues to have low back pain.  Progressed exercises today, introducing extension exercises with side glide (left side to wall) and repeated extension at counter, tolerated well.  Discussed performing PPT for TrA contraction (no pain) v. Just pulling in stomach (increases pain), and focus on PPT.  She was able to do all exercises without pain, reported feeling weak with clams in glut med.  Noted tenderness in lumbar multifidi and R glut med and piriformis, and after explanation of DN rational, procedures, outcomes and potential side effects, patient verbalized consent to DN treatment in conjunction with manual STM/DTM and TPR to reduce ttp/muscle tension. Muscles treated as indicated above. She reported increased pain/tenderness with DN int lumbar multifidi, so moved to R glut med and piriformis instead, DN here produced normal response with good twitches elicited resulting in palpable reduction in pain/ttp and muscle tension.  Pt. Educated to expect moderate muscle soreness for up to 24-48 hrs and instructed to continue prescribed home exercise program and current activity level with pt verbalizing understanding of theses instructions.   CP applied to lumbar spine in sitting x 10 min to decrease pain in low back at end of session.  Maurilio Lovely continues to demonstrate potential for improvement and would benefit from continued skilled therapy to address impairments.      OBJECTIVE IMPAIRMENTS decreased ROM, decreased strength, increased fascial restrictions, increased muscle spasms, improper body mechanics, and pain.   ACTIVITY LIMITATIONS carrying,  lifting, bending, sitting, standing, and stairs  PARTICIPATION LIMITATIONS: meal prep, cleaning, laundry, and occupation  PERSONAL FACTORS Time since onset of injury/illness/exacerbation and 1-2 comorbidities: lupus, fibromyalgia  are also affecting patient's functional outcome.   REHAB POTENTIAL: Good  CLINICAL DECISION MAKING: Stable/uncomplicated  EVALUATION COMPLEXITY: Low   GOALS: Goals reviewed with patient? Yes  SHORT TERM GOALS: Target date: 06/26/2022   Patient will be independent with initial HEP.  Baseline: given Goal status: MET  LONG TERM GOALS: Target date: 07/24/2022   Patient will be independent with advanced/ongoing HEP to improve outcomes and carryover.  Baseline: needs progression Goal status: IN PROGRESS  2.  Patient will report 75% improvement in low back pain to improve QOL.  Baseline: 2-3/10 with intermittent radicular symptoms.  Goal status: IN PROGRESS  3.  Patient will demonstrate full pain free lumbar ROM to perform ADLs.   Baseline: see objective Goal status: IN PROGRESS  4.  Patient will report <20% disability on modified Oswestry to demonstrate improved functional ability.  Baseline: 28% (moderate) disability  Goal status: IN PROGRESS   5.  Patient will tolerate 45 min of standing or sitting without increased LBP.  Baseline: <15 minutes before needs to change position Goal status: IN PROGRESS  6.  Patient will be able to return to regular gym based program. (Weights and cardio) Baseline: stopped due to LB pain Goal status: IN PROGRESS   PLAN: PT FREQUENCY: 1-2x/week  PT DURATION: 6 weeks  PLANNED INTERVENTIONS: Therapeutic exercises, Therapeutic activity, Neuromuscular re-education, Balance training, Gait training, Patient/Family education, Joint mobilization, Stair training, Dry Needling, Wheelchair mobility training, Spinal mobilization, Cryotherapy, Moist heat, Traction, Ultrasound, and Manual therapy.  PLAN FOR NEXT SESSION:  review and progress HEP - neutral spine exercises but try to progress extension also, dry needling/manual therapy, modalities  PRN   Rennie Natter, PT 07/02/2022, 10:45 AM

## 2022-07-04 ENCOUNTER — Ambulatory Visit: Payer: 59

## 2022-07-04 DIAGNOSIS — R252 Cramp and spasm: Secondary | ICD-10-CM

## 2022-07-04 DIAGNOSIS — G8929 Other chronic pain: Secondary | ICD-10-CM

## 2022-07-04 DIAGNOSIS — M5442 Lumbago with sciatica, left side: Secondary | ICD-10-CM | POA: Diagnosis not present

## 2022-07-04 DIAGNOSIS — M6281 Muscle weakness (generalized): Secondary | ICD-10-CM

## 2022-07-04 NOTE — Therapy (Signed)
OUTPATIENT PHYSICAL THERAPY TREATMENT   Patient Name: Wendy Spencer MRN: [id number] DOB:1976/04/07, 46 y.o., female Today's Date: 07/04/2022   PT End of Session - 07/04/22 1616     Visit Number 4    PT Start Time 3710    PT Stop Time 6269    PT Time Calculation (min) 41 min    Activity Tolerance Patient tolerated treatment well    Behavior During Therapy WFL for tasks assessed/performed               Past Medical History:  Diagnosis Date   Anemia    Anxiety    Depression    Fibromyalgia    GERD (gastroesophageal reflux disease)    Hereditary hemochromatosis ([place])    Insomnia    Rosacea    Systemic lupus erythematosus ([place])    Vitamin D deficiency     Patient Active Problem List   Diagnosis Date Noted   Hereditary hemochromatosis ([place]) 01/06/2021   Systemic lupus erythematosus ([place]) 01/06/2021   Fibromyalgia 01/06/2021   GERD (gastroesophageal reflux disease) 01/06/2021   Vitamin D deficiency 01/06/2021   Rosacea 01/06/2021   Anxiety state 01/06/2021   Insomnia 01/06/2021   H/O gastric sleeve 01/06/2021   Obstructive sleep apnea on CPAP 07/18/2016    PCP: Lydia Guiles MD  REFERRING PROVIDER: Earnie Larsson, MD  REFERRING DIAG: M54.16 (ICD-10-CM) - Radiculopathy, lumbar region  Rationale for Evaluation and Treatment Rehabilitation  THERAPY DIAG:  Chronic bilateral low back pain with left-sided sciatica  Cramp and spasm  Muscle weakness (generalized)  ONSET DATE: chronic LBP for 7 years, worsened last year.   SUBJECTIVE:                                                                                                                                                                                           SUBJECTIVE STATEMENT: The back has his moments. Most of the time it does not hurt. The DN helped in the glutes more than the back.   PERTINENT HISTORY:  hemochromatosis, lupus, h/o gastric sleeve, fibromyalgia, GERD  PAIN:  Are you  having pain? Yes: NPRS scale: 2/10 Pain location: low back  intermittent radiates down R buttock, and down to L foot, intermittent 7/10 sharp Pain description: dull aching Aggravating factors: prolonged sitting > 15 min or standing > 15 min  Relieving factors: changing positions, stretches, hot water   PRECAUTIONS: None  WEIGHT BEARING RESTRICTIONS No  FALLS:  Has patient fallen in last 6 months? 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  Reports not available for review.   PATIENT SURVEYS:  Modified Oswestry 14/50 = 28% disability (moderate)   SCREENING FOR RED FLAGS: Bowel or bladder incontinence: No   COGNITION:  Overall cognitive status: Within functional limits for tasks assessed     SENSATION: WFL  MUSCLE LENGTH: Hamstrings: Right ~ 90 deg; Left ~90 deg Ely's test: Right 125 deg; Left 110 deg  POSTURE: No Significant postural limitations  PALPATION: Tenderness with PA mobs from L1-L5, R SIJ, bil piriformis and glute med.   LUMBAR ROM:   Active  A/PROM  eval  Flexion To knees* pulling/pain  Extension 25% limited, inc pain  Right lateral flexion To knee, not as painful as L, stiff  Left lateral flexion To knee, pain, stiff   Right rotation Inc pain   Left rotation Inc pain   (Blank rows = not tested)  LOWER EXTREMITY ROM:   good hip hip mobility, pain free, WNL, symmetric   LOWER EXTREMITY MMT:    MMT Right eval Left eval  Hip flexion 4+ 4+  Hip extension 4+* 4+*  Hip abduction 5 5  Hip adduction 5 5  Knee flexion    Knee extension 5 5  Ankle dorsiflexion 5 5   (Blank rows = not tested)  * pain  LUMBAR  SPECIAL TESTS:  Straight leg raise test: neg but increased pain on L at 90 deg, Quadrant test: Positive, FABER test: Negative, and Long sit test: Negative  GAIT: Distance walked: 50 Assistive device utilized: None Level of assistance: Complete Independence Comments: no device or deviation.    TODAY'S TREATMENT  07/04/22 Therapeutic Exercise: Bike L2x28min Standing row 15lb 2x10 Pallof press with cable 5lb 2x10 bil Multifidus walkout 5lb 2x10 bil Standing hip abduction x 10 Standing hip extension x 10  Manual Therapy: STM to R glutes, focus on priformis, lumbar paraspinals and QL   07/02/2022 Therapeutic Exercise: to improve strength and mobility.  Demo, verbal and tactile cues throughout for technique. Bike x 6 min Side glides at wall x 10 L side facing wall Standing back extension x 10 (at counter) Supine: LTR x 10 SKTC stretch x 5 bil  PPT x 10  Bridge 2 x 10 with PPT Supine clam RTB 2 x 10 with PPT S/l clams 2 x 10 bil RTB  Manual Therapy: to decrease muscle spasm and pain and improve mobility.  STM/TPR to R lumbar paraspinals, glut med and piriformis, R UPA mobs L4-5 grade 1-2, skilled palpation and monitoring during dry needling. Trigger Point Dry-Needling  Treatment instructions: Expect mild to moderate muscle soreness. S/S of pneumothorax if dry needled over a lung field, and to seek immediate medical attention should they occur. Patient verbalized understanding of these instructions and education.  Patient Consent Given: Yes Education handout provided: Yes Muscles treated: R L5 multifidi, R glut med, R piriformis Treatment response/outcome: Twitch Response Elicited and Palpable Increase in Muscle Length    06/27/22 Therapeutic Exercise: Bike L2x24min Supine pelvic tilt 10x LTR 10x Supine hip ER/IR 10x Supine Bridge 10x Supine march with GTB 10x Supine clam with GTB 10x Quadruped arm raises 10x Quadruped glute kicks 10x Manual Therapy: STM to R glute med,  piriformis  6/27/2023Therapeutic Exercise: to improve strength and mobility.  Demo,  verbal and tactile cues throughout for technique.   - Prone x 3 min  - Prone Knee Flexion AROM 10 reps bil - Supine Posterior Pelvic Tilt   10 reps - Supine Hip Internal and External Rotation - 10 reps   PATIENT EDUCATION:  Education details: dry needling Person educated: Patient Education method: Explanation and Handouts Education comprehension: verbalized understanding   HOME EXERCISE PROGRAM: Access Code: [email or web address]  ASSESSMENT:  CLINICAL IMPRESSION:  Pt noted good benefit form DN in R glutes last visit but pain with DN to low back. Worked on core stab exercises and hip strengthening in weight bearing, providing cues for core stability and upright posture. Pt noted a good challenge with the pallof presses and multifidus walkouts. Toward the end of TE, pt demonstrated some fatigue in the muscles, so we finished session with STM to reduce muscle tension and soreness post exercise. She remains tender along piriformis and lumbar paraspinals toward the sacrum.    OBJECTIVE IMPAIRMENTS decreased ROM, decreased strength, increased fascial restrictions, increased muscle spasms, improper body mechanics, and pain.   ACTIVITY LIMITATIONS carrying, lifting, bending, sitting, standing, and stairs  PARTICIPATION LIMITATIONS: meal prep, cleaning, laundry, and occupation  PERSONAL FACTORS Time since onset of injury/illness/exacerbation and 1-2 comorbidities: lupus, fibromyalgia  are also affecting patient's functional outcome.   REHAB POTENTIAL: Good  CLINICAL DECISION MAKING: Stable/uncomplicated  EVALUATION COMPLEXITY: Low   GOALS: Goals reviewed with patient? Yes  SHORT TERM GOALS: Target date: 06/26/2022   Patient will be independent with initial HEP.  Baseline: given Goal status: MET  LONG TERM GOALS: Target date: 07/24/2022   Patient will be independent with advanced/ongoing HEP to improve  outcomes and carryover.  Baseline: needs progression Goal status: IN PROGRESS  2.  Patient will report 75% improvement in low back pain to improve QOL.  Baseline: 2-3/10 with intermittent radicular symptoms.  Goal status: IN PROGRESS  3.  Patient will demonstrate full pain free lumbar ROM to perform ADLs.   Baseline: see objective Goal status: IN PROGRESS  4.  Patient will report <20% disability on modified Oswestry to demonstrate improved functional ability.  Baseline: 28% (moderate) disability  Goal status: IN PROGRESS   5.  Patient will tolerate 45 min of standing or sitting without increased LBP.  Baseline: <15 minutes before needs to change position Goal status: IN PROGRESS  6.  Patient will be able to return to regular gym based program. (Weights and cardio) Baseline: stopped due to LB pain Goal status: IN PROGRESS   PLAN: PT FREQUENCY: 1-2x/week  PT DURATION: 6 weeks  PLANNED INTERVENTIONS: Therapeutic exercises, Therapeutic activity, Neuromuscular re-education, Balance training, Gait training, Patient/Family education, Joint mobilization, Stair training, Dry Needling, Wheelchair mobility training, Spinal mobilization, Cryotherapy, Moist heat, Traction, Ultrasound, and Manual therapy.  PLAN FOR NEXT SESSION: review and progress HEP - neutral spine exercises but try to progress extension also, dry needling/manual therapy, modalities PRN   Artist Pais, PTA 07/04/2022, 4:21 PM

## 2022-07-09 ENCOUNTER — Ambulatory Visit: Payer: 59 | Admitting: Physical Therapy

## 2022-07-11 ENCOUNTER — Ambulatory Visit: Payer: 59 | Admitting: Physical Therapy

## 2022-07-16 ENCOUNTER — Ambulatory Visit: Payer: 59

## 2022-07-16 DIAGNOSIS — G8929 Other chronic pain: Secondary | ICD-10-CM

## 2022-07-16 DIAGNOSIS — R252 Cramp and spasm: Secondary | ICD-10-CM

## 2022-07-16 DIAGNOSIS — M5442 Lumbago with sciatica, left side: Secondary | ICD-10-CM | POA: Diagnosis not present

## 2022-07-16 DIAGNOSIS — M6281 Muscle weakness (generalized): Secondary | ICD-10-CM

## 2022-07-16 NOTE — Therapy (Signed)
OUTPATIENT PHYSICAL THERAPY TREATMENT   Patient Name: Wendy Spencer MRN: [id number] DOB:[birth year], 46 y.o., female Today's Date: 07/16/2022   PT End of Session - 07/16/22 1100     Visit Number 5    Date for PT Re-Evaluation 07/24/22    Authorization Type [employer]    PT Start Time 1018    PT Stop Time 1100    PT Time Calculation (min) 42 min    Activity Tolerance Patient tolerated treatment well    Behavior During Therapy WFL for tasks assessed/performed                Past Medical History:  Diagnosis Date   Anemia    Anxiety    Depression    Fibromyalgia    GERD (gastroesophageal reflux disease)    Hereditary hemochromatosis ([place])    Insomnia    Rosacea    Systemic lupus erythematosus ([place])    Vitamin D deficiency     Patient Active Problem List   Diagnosis Date Noted   Hereditary hemochromatosis ([place]) 01/06/2021   Systemic lupus erythematosus ([place]) 01/06/2021   Fibromyalgia 01/06/2021   GERD (gastroesophageal reflux disease) 01/06/2021   Vitamin D deficiency 01/06/2021   Rosacea 01/06/2021   Anxiety state 01/06/2021   Insomnia 01/06/2021   H/O gastric sleeve 01/06/2021   Obstructive sleep apnea on CPAP 07/18/2016    PCP: Lydia Guiles MD  REFERRING PROVIDER: Earnie Larsson, MD  REFERRING DIAG: M54.16 (ICD-10-CM) - Radiculopathy, lumbar region  Rationale for Evaluation and Treatment Rehabilitation  THERAPY DIAG:  Chronic bilateral low back pain with left-sided sciatica  Cramp and spasm  Muscle weakness (generalized)  ONSET DATE: chronic LBP for 7 years, worsened last year.   SUBJECTIVE:                                                                                                                                                                                           SUBJECTIVE STATEMENT: Went back to the gym this past week and switched to vegan diet, which has seemed to help.   PERTINENT HISTORY:  hemochromatosis, lupus, h/o  gastric sleeve, fibromyalgia, GERD  PAIN:  Are you having pain? Yes: NPRS scale: 1/10 Pain location: low back  intermittent radiates down R buttock, and down to L foot, intermittent 7/10 sharp Pain description: dull aching Aggravating factors: prolonged sitting > 15 min or standing > 15 min  Relieving factors: changing positions, stretches, hot water   PRECAUTIONS: None  WEIGHT BEARING RESTRICTIONS No  FALLS:  Has patient fallen in last 6 months? 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  Reports not available for review.   PATIENT SURVEYS:  Modified Oswestry 14/50 = 28% disability (moderate)   SCREENING FOR RED FLAGS: Bowel or bladder incontinence: No   COGNITION:  Overall cognitive status: Within functional limits for tasks assessed     SENSATION: WFL  MUSCLE LENGTH: Hamstrings: Right ~ 90 deg; Left ~90 deg Ely's test: Right 125 deg; Left 110 deg  POSTURE: No Significant postural limitations  PALPATION: Tenderness with PA mobs from L1-L5, R SIJ, bil piriformis and glute med.   LUMBAR ROM:   Active  A/PROM  eval AROM 07/16/22  Flexion To knees* pulling/pain To distal leg- light pain  Extension 25% limited, inc pain WFL- light pain  Right lateral flexion To knee, not as painful as L, stiff To distal thigh - pain  Left lateral flexion To knee, pain, stiff  To knee  Right rotation Inc pain  WFL - light pain  Left rotation Inc pain WFL- light pain   (Blank rows = not tested)  LOWER EXTREMITY ROM:   good hip hip mobility, pain free, WNL, symmetric   LOWER EXTREMITY MMT:    MMT Right eval Left eval  Hip flexion 4+ 4+   Hip extension 4+* 4+*  Hip abduction 5 5  Hip adduction 5 5  Knee flexion    Knee extension 5 5  Ankle dorsiflexion 5 5   (Blank rows = not tested)  * pain  LUMBAR SPECIAL TESTS:  Straight leg raise test: neg but increased pain on L at 90 deg, Quadrant test: Positive, FABER test: Negative, and Long sit test: Negative  GAIT: Distance walked: 50 Assistive device utilized: None Level of assistance: Complete Independence Comments: no device or deviation.    TODAY'S TREATMENT  07/16/22 Therapeutic Exercise: Bike L2x80min Standing trunk rotation with GTB x 10 R/L Standing chops with GTB x 10 R/L Standing shoulder extension GTB 2x10  Prone on elbows x 30 sec Childs pose 3 way 2 x 30 sec each way  07/04/22 Therapeutic Exercise: Bike L2x54min Standing row 15lb 2x10 Pallof press with cable 5lb 2x10 bil Multifidus walkout 5lb 2x10 bil Standing hip abduction x 10 Standing hip extension x 10  Manual Therapy: STM to R glutes, focus on priformis, lumbar paraspinals and QL   07/02/2022 Therapeutic Exercise: to improve strength and mobility.  Demo, verbal and tactile cues throughout for technique. Bike x 6 min Side glides at wall x 10 L side facing wall Standing back extension x 10 (at counter) Supine: LTR x 10 SKTC stretch x 5 bil  PPT x 10  Bridge 2 x 10 with PPT Supine clam RTB 2 x 10 with PPT S/l clams 2 x 10 bil RTB  Manual Therapy: to decrease muscle spasm and pain and improve mobility.  STM/TPR to R lumbar paraspinals, glut med and piriformis, R UPA mobs L4-5 grade 1-2, skilled palpation and monitoring during dry needling. Trigger Point Dry-Needling  Treatment instructions: Expect mild to moderate muscle soreness. S/S of pneumothorax if dry needled over a lung field, and to seek immediate medical attention should they occur. Patient verbalized understanding of these instructions and education.  Patient Consent Given: Yes Education handout provided: Yes Muscles treated:  R L5 multifidi, R glut med, R piriformis Treatment response/outcome: Twitch Response Elicited and Palpable Increase in Muscle Length     PATIENT EDUCATION:  Education details: dry needling Person educated: Patient Education method: Explanation and Handouts Education comprehension: verbalized understanding   HOME EXERCISE PROGRAM: Access Code: [email or web address]  ASSESSMENT:  CLINICAL IMPRESSION:  Pt demonstrates improvement in lumbar ROM however notes pain with most movements. She reports 60% overall improvement in LBP. Advanced through progression of exercises, to improve core stability, strength, and lumbar mobility. Updated HEP to include stretches to improve lumbar flexion and SB, extension was unbearable past prone on elbows. No complaints at end of session.    OBJECTIVE IMPAIRMENTS decreased ROM, decreased strength, increased fascial restrictions, increased muscle spasms, improper body mechanics, and pain.   ACTIVITY LIMITATIONS carrying, lifting, bending, sitting, standing, and stairs  PARTICIPATION LIMITATIONS: meal prep, cleaning, laundry, and occupation  PERSONAL FACTORS Time since onset of injury/illness/exacerbation and 1-2 comorbidities: lupus, fibromyalgia  are also affecting patient's functional outcome.   REHAB POTENTIAL: Good  CLINICAL DECISION MAKING: Stable/uncomplicated  EVALUATION COMPLEXITY: Low   GOALS: Goals reviewed with patient? Yes  SHORT TERM GOALS: Target date: 06/26/2022   Patient will be independent with initial HEP.  Baseline: given Goal status: MET  LONG TERM GOALS: Target date: 07/24/2022   Patient will be independent with advanced/ongoing HEP to improve outcomes and carryover.  Baseline: needs progression Goal status: IN PROGRESS  2.  Patient will report 75% improvement in low back pain to improve QOL.  Baseline: 2-3/10 with intermittent radicular symptoms.  Goal status: IN PROGRESS - 60% improvement- no more radicular symptoms since  starting PT - 07/16/22  3.  Patient will demonstrate full pain free lumbar ROM to perform ADLs.   Baseline: see objective Goal status: IN PROGRESS   4.  Patient will report <20% disability on modified Oswestry to demonstrate improved functional ability.  Baseline: 28% (moderate) disability  Goal status: IN PROGRESS   5.  Patient will tolerate 45 min of standing or sitting without increased LBP.  Baseline: <15 minutes before needs to change position Goal status: IN PROGRESS  6.  Patient will be able to return to regular gym based program. (Weights and cardio) Baseline: stopped due to LB pain Goal status: IN PROGRESS   PLAN: PT FREQUENCY: 1-2x/week  PT DURATION: 6 weeks  PLANNED INTERVENTIONS: Therapeutic exercises, Therapeutic activity, Neuromuscular re-education, Balance training, Gait training, Patient/Family education, Joint mobilization, Stair training, Dry Needling, Wheelchair mobility training, Spinal mobilization, Cryotherapy, Moist heat, Traction, Ultrasound, and Manual therapy.  PLAN FOR NEXT SESSION: review and progress HEP - neutral spine exercises but try to progress extension also, dry needling/manual therapy, modalities PRN   Artist Pais, PTA 07/16/2022, 11:01 AM

## 2022-07-18 ENCOUNTER — Encounter: Payer: 59 | Admitting: Physical Therapy

## 2022-07-23 ENCOUNTER — Ambulatory Visit: Payer: 59 | Attending: Neurosurgery | Admitting: Physical Therapy

## 2022-07-23 ENCOUNTER — Encounter: Payer: Self-pay | Admitting: Physical Therapy

## 2022-07-23 DIAGNOSIS — R252 Cramp and spasm: Secondary | ICD-10-CM | POA: Diagnosis present

## 2022-07-23 DIAGNOSIS — M5442 Lumbago with sciatica, left side: Secondary | ICD-10-CM | POA: Diagnosis present

## 2022-07-23 DIAGNOSIS — G8929 Other chronic pain: Secondary | ICD-10-CM | POA: Insufficient documentation

## 2022-07-23 DIAGNOSIS — M6281 Muscle weakness (generalized): Secondary | ICD-10-CM | POA: Insufficient documentation

## 2022-07-23 NOTE — Therapy (Addendum)
OUTPATIENT PHYSICAL THERAPY TREATMENT  PHYSICAL THERAPY DISCHARGE SUMMARY  Visits from Start of Care: 6  Current functional level related to goals / functional outcomes: 60% improvement in LBP, centralization of symptoms, met LTG #1, 4, and 6 and progress remaining goals. Modified Oswestry = 18%   Remaining deficits: LBP with extension   Education / Equipment: HEP  Plan: Patient agrees to discharge.  Patient is being discharged due to meeting the stated rehab goals.  Patient was placed on 30 day hold on 07/23/2022 and has not needed to return to therapy in the stated time frame.   Rennie Natter, PT, DPT 9:21 AM 08/22/2022      Progress Note Reporting Period 06/12/2022 to 07/23/2022  See note below for Objective Data and Assessment of Progress/Goals.      Patient Name: Cambrie Sonnenfeld MRN: [id number] DOB:[birth year], 46 y.o., female Today's Date: 07/23/2022   PT End of Session - 07/23/22 1104     Visit Number 6    Date for PT Re-Evaluation 07/24/22    Authorization Type [employer]    PT Start Time 1102    PT Stop Time 1147    PT Time Calculation (min) 45 min    Activity Tolerance Patient tolerated treatment well    Behavior During Therapy WFL for tasks assessed/performed                Past Medical History:  Diagnosis Date   Anemia    Anxiety    Depression    Fibromyalgia    GERD (gastroesophageal reflux disease)    Hereditary hemochromatosis ([place])    Insomnia    Rosacea    Systemic lupus erythematosus ([place])    Vitamin D deficiency     Patient Active Problem List   Diagnosis Date Noted   Hereditary hemochromatosis ([place]) 01/06/2021   Systemic lupus erythematosus ([place]) 01/06/2021   Fibromyalgia 01/06/2021   GERD (gastroesophageal reflux disease) 01/06/2021   Vitamin D deficiency 01/06/2021   Rosacea 01/06/2021   Anxiety state 01/06/2021   Insomnia 01/06/2021   H/O gastric sleeve 01/06/2021   Obstructive sleep apnea on CPAP 07/18/2016     PCP: Lydia Guiles MD  REFERRING PROVIDER: Earnie Larsson, MD  REFERRING DIAG: M54.16 (ICD-10-CM) - Radiculopathy, lumbar region  Rationale for Evaluation and Treatment Rehabilitation  THERAPY DIAG:  Chronic bilateral low back pain with left-sided sciatica  Cramp and spasm  Muscle weakness (generalized)  ONSET DATE: chronic LBP for 7 years, worsened last year.   SUBJECTIVE:  SUBJECTIVE STATEMENT: Patient reports back is doing better, the stretches really help.  Feels she has enough to work on to continue progress.    PERTINENT HISTORY:  hemochromatosis, lupus, h/o gastric sleeve, fibromyalgia, GERD  PAIN:  Are you having pain? Yes: NPRS scale: 2/10 Pain location: low back  Pain description: dull aching Aggravating factors: prolonged sitting > 15 min or standing > 15 min  Relieving factors: changing positions, stretches, hot water   PRECAUTIONS: None  WEIGHT BEARING RESTRICTIONS No  FALLS:  Has patient fallen in last 6 months? 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  Reports not available for review.   PATIENT SURVEYS:  Modified Oswestry 14/50 = 28% disability (moderate)   07/23/2022 9/50 = 18%.   SCREENING FOR RED FLAGS: Bowel or bladder incontinence: No   COGNITION:  Overall cognitive status: Within functional limits for tasks assessed     SENSATION: WFL  MUSCLE LENGTH: Hamstrings: Right ~ 90 deg; Left ~90 deg Ely's test:  Right 125 deg; Left 110 deg  POSTURE: No Significant postural limitations  PALPATION: Tenderness with PA mobs from L1-L5, R SIJ, bil piriformis and glute med.   LUMBAR ROM:   Active  A/PROM  eval AROM 07/16/22  Flexion To knees* pulling/pain To distal leg- light pain  Extension 25% limited, inc pain WFL- light pain  Right lateral flexion To knee, not as painful as L, stiff To distal thigh - pain  Left lateral flexion To knee, pain, stiff  To knee  Right rotation Inc pain  WFL - light pain  Left rotation Inc pain WFL- light pain   (Blank rows = not tested)  LOWER EXTREMITY ROM:   good hip hip mobility, pain free, WNL, symmetric   LOWER EXTREMITY MMT:    MMT Right eval Left eval  Hip flexion 4+ 4+  Hip extension 4+* 4+*  Hip abduction 5 5  Hip adduction 5 5  Knee flexion    Knee extension 5 5  Ankle dorsiflexion 5 5   (Blank rows = not tested)  * pain  LUMBAR SPECIAL TESTS:  Straight leg raise test: neg but increased pain on L at 90 deg, Quadrant test: Positive, FABER test: Negative, and Long sit test: Negative  GAIT: Distance walked: 50 Assistive device utilized: None Level of assistance: Complete Independence Comments: no device or deviation.    TODAY'S TREATMENT  07/23/2022 Therapeutic Exercise: to improve strength and mobility. Nustep L5 x 5 min LE only  Bridges with PPT x 10 - no pain with bracing Prone leg extensions x 10 bil  Bird dogs x 10 bil - child pose stretch between [employer] press with GTB x 10 R/L Walkouts with GTB x 10 R/L Standing chops with GTB x 10 R/L Reverse chops with GTB x 10 R/L RDLs - with counter for support x 10 R/L Therapeutic Activity:  education on lifting mechanics, with demo and return demo for lifting box from floor through squatting, keeping heavy items close to spine while carrying, avoiding twisting with load, golfer's lift, reaching without flexing spine.   07/16/22 Therapeutic Exercise: Bike L2x24mn Standing trunk rotation  with GTB x 10 R/L Standing  chops with GTB x 10 R/L Standing shoulder extension GTB 2x10  Prone on elbows x 30 sec Childs pose 3 way 2 x 30 sec each way  07/04/22 Therapeutic Exercise: Bike L2x80mn Standing row 15lb 2x10 Pallof press with cable 5lb 2x10 bil Multifidus walkout 5lb 2x10 bil Standing hip abduction x 10 Standing hip extension x 10  Manual Therapy: STM to R glutes, focus on priformis, lumbar paraspinals and QL   07/02/2022 Therapeutic Exercise: to improve strength and mobility.  Demo, verbal and tactile cues throughout for technique. Bike x 6 min Side glides at wall x 10 L side facing wall Standing back extension x 10 (at counter) Supine: LTR x 10 SKTC stretch x 5 bil  PPT x 10  Bridge 2 x 10 with PPT Supine clam RTB 2 x 10 with PPT S/l clams 2 x 10 bil RTB  Manual Therapy: to decrease muscle spasm and pain and improve mobility.  STM/TPR to R lumbar paraspinals, glut med and piriformis, R UPA mobs L4-5 grade 1-2, skilled palpation and monitoring during dry needling. Trigger Point Dry-Needling  Treatment instructions: Expect mild to moderate muscle soreness. S/S of pneumothorax if dry needled over a lung field, and to seek immediate medical attention should they occur. Patient verbalized understanding of these instructions and education.  Patient Consent Given: Yes Education handout provided: Yes Muscles treated: R L5 multifidi, R glut med, R piriformis Treatment response/outcome: Twitch Response Elicited and Palpable Increase in Muscle Length     PATIENT EDUCATION:  Education details: HEP progression, review and body mechanics with lifting.   Person educated: Patient Education method: ETheatre stage managerEducation comprehension: verbalized understanding and returned demonstration   HOME EXERCISE PROGRAM: Access Code: [email or web address] ASSESSMENT:  CLINICAL IMPRESSION: DAthaliah Baumbachhas made good progress, reports centralization of radicular  symptoms, 60% improvement overall in LBP, but continues to have midline LBP expecially with extension.  Today focused on reviewing and progressing HEP to continue core strengthening, especially working on anti-rotational exercises for core.  She was challenged today but able to perform all exercises without pain, even bridges when cued to brace TrA.  She was also educated on body mechanics and lifting techniques.  She is being placed on 30 day hold today with planned discharge at end of this period if does not require further skilled physical therapy.    OBJECTIVE IMPAIRMENTS decreased ROM, decreased strength, increased fascial restrictions, increased muscle spasms, improper body mechanics, and pain.   ACTIVITY LIMITATIONS carrying, lifting, bending, sitting, standing, and stairs  PARTICIPATION LIMITATIONS: meal prep, cleaning, laundry, and occupation  PERSONAL FACTORS Time since onset of injury/illness/exacerbation and 1-2 comorbidities: lupus, fibromyalgia  are also affecting patient's functional outcome.   REHAB POTENTIAL: Good  CLINICAL DECISION MAKING: Stable/uncomplicated  EVALUATION COMPLEXITY: Low   GOALS: Goals reviewed with patient? Yes  SHORT TERM GOALS: Target date: 06/26/2022   Patient will be independent with initial HEP.  Baseline: given Goal status: MET  LONG TERM GOALS: Target date: 07/24/2022   Patient will be independent with advanced/ongoing HEP to improve outcomes and carryover.  Baseline: needs progression Goal status: MET  07/23/2022 reports daily performance.   2.  Patient will report 75% improvement in low back pain to improve QOL.  Baseline: 2-3/10 with intermittent radicular symptoms.  Goal status: IN PROGRESS - 60% improvement- no more radicular symptoms since starting PT - 07/16/22  3.  Patient will demonstrate full pain free lumbar ROM to perform ADLs.   Baseline: see objective Goal  status: IN PROGRESS 07/23/2022 - still has pain midline low back.   4.   Patient will report <20% disability on modified Oswestry to demonstrate improved functional ability.  Baseline: 28% (moderate) disability  Goal status: MET  9/50 = 18% disability    5.  Patient will tolerate 45 min of standing or sitting without increased LBP.  Baseline: <15 minutes before needs to change position Goal status: IN PROGRESS  07/23/22 - reports improvement but still needs to change position frequently.   6.  Patient will be able to return to regular gym based program. (Weights and cardio) Baseline: stopped due to LB pain Goal status: MET 07/23/2022 - returned to gym workouts.    PLAN: PT FREQUENCY: 1-2x/week  PT DURATION: 6 weeks  PLANNED INTERVENTIONS: Therapeutic exercises, Therapeutic activity, Neuromuscular re-education, Balance training, Gait training, Patient/Family education, Joint mobilization, Stair training, Dry Needling, Wheelchair mobility training, Spinal mobilization, Cryotherapy, Moist heat, Traction, Ultrasound, and Manual therapy.  PLAN FOR NEXT SESSION: 30 day hold.    Rennie Natter, PT, DPT  07/23/2022, 11:58 AM

## 2022-08-10 ENCOUNTER — Encounter (HOSPITAL_COMMUNITY): Payer: Self-pay | Admitting: *Deleted

## 2022-08-10 ENCOUNTER — Ambulatory Visit (HOSPITAL_COMMUNITY)
Admission: EM | Admit: 2022-08-10 | Discharge: 2022-08-10 | Disposition: A | Discharge status: Home / Self Care | Payer: 59 | Attending: Emergency Medicine | Admitting: Emergency Medicine

## 2022-08-10 DIAGNOSIS — Z20822 Contact with and (suspected) exposure to covid-19: Secondary | ICD-10-CM | POA: Insufficient documentation

## 2022-08-10 DIAGNOSIS — J069 Acute upper respiratory infection, unspecified: Secondary | ICD-10-CM | POA: Insufficient documentation

## 2022-08-10 LAB — SARS CORONAVIRUS 2 BY RT PCR: SARS Coronavirus 2 by RT PCR: NEGATIVE

## 2022-08-10 NOTE — ED Provider Notes (Signed)
[facility]    CSN: [id number] Arrival date & time: 08/10/22  1148      History   Chief Complaint Chief Complaint  Patient presents with   Cough    Slight cough, low grade fever, sore throat. Exposed to cobud Monday by son - Entered by patient   Covid Exposure    HPI Wendy Spencer is a 46 y.o. female.  Presents with COVID exposure. On Monday she was around her son who tested positive for COVID. She began having symptoms last night.  Reports low-grade (99) temperature, fatigue, body aches, sore throat.  Additionally nasal congestion but no cough. Denies abdominal pain, nausea, vomiting/diarrhea.  Has tried Tylenol which helped with her temperature but other symptoms have persisted  Took an at home COVID test last night that was negative  History of lupus  Past Medical History:  Diagnosis Date   Anemia    Anxiety    Depression    Fibromyalgia    GERD (gastroesophageal reflux disease)    Hereditary hemochromatosis ([facility])    Insomnia    Rosacea    Systemic lupus erythematosus ([facility])    Vitamin D deficiency     Patient Active Problem List   Diagnosis Date Noted   Hereditary hemochromatosis ([facility]) 01/06/2021   Systemic lupus erythematosus ([facility]) 01/06/2021   Fibromyalgia 01/06/2021   GERD (gastroesophageal reflux disease) 01/06/2021   Vitamin D deficiency 01/06/2021   Rosacea 01/06/2021   Anxiety state 01/06/2021   Insomnia 01/06/2021   H/O gastric sleeve 01/06/2021   Obstructive sleep apnea on CPAP 07/18/2016    Past Surgical History:  Procedure Laterality Date   CHOLECYSTECTOMY  2008   DILATION AND CURETTAGE OF UTERUS  1994   LAPAROSCOPIC GASTRIC SLEEVE RESECTION  2017    OB History   No obstetric history on file.      Home Medications    Prior to Admission medications   Medication Sig Start Date End Date Taking? Authorizing Provider  ALPRAZolam (XANAX) 0.25 MG tablet Take 0.25 mg by mouth 3 (three) times daily as needed for  anxiety.   Yes [provider]  diclofenac Sodium (VOLTAREN) 1 % GEL Apply topically. 09/29/19  Yes [provider]  diphenhydrAMINE (BENADRYL) 25 mg capsule Take by mouth.   Yes [provider]  EPINEPHrine 0.3 mg/0.3 mL IJ SOAJ injection Inject 0.3 mg into the muscle as needed for anaphylaxis.   Yes [provider]  hydroxychloroquine (PLAQUENIL) 200 MG tablet Take by mouth. 12/20/15  Yes [provider]  pantoprazole (PROTONIX) 40 MG tablet Take 1 tablet by mouth daily. 03/09/21  Yes [provider]  valACYclovir (VALTREX) 1000 MG tablet Take 4,000 mg by mouth once. 03/22/21  Yes [provider]  DEXILANT 60 MG capsule Take 1 capsule by mouth daily. Patient not taking: Reported on 06/12/2022 11/23/20   [provider]  DULoxetine (CYMBALTA) 20 MG capsule Take 20 mg by mouth daily. Patient not taking: Reported on 06/12/2022 11/23/20   [provider]  famotidine (PEPCID) 20 MG tablet Take by mouth. Patient not taking: Reported on 06/12/2022 09/06/18   [provider]  fluconazole (DIFLUCAN) 150 MG tablet Take 150 mg by mouth once. 05/08/21   [provider]  naltrexone (DEPADE) 50 MG tablet Take by mouth. Patient not taking: Reported on 06/12/2022    [provider]  ondansetron (ZOFRAN-ODT) 4 MG disintegrating tablet Take 4 mg by mouth every 8 (eight) hours as needed. 08/26/20   [provider]  ondansetron (ZOFRAN-ODT) 4 MG disintegrating tablet Take by mouth.    [provider]  predniSONE (DELTASONE) 5 MG tablet Take by mouth. Patient not taking: Reported on 06/12/2022 05/04/21   [provider]  senna-docusate (SENOKOT-S) 8.6-50 MG tablet Take 1 tablet by mouth 2 (two) times daily. Patient not taking: Reported on 06/12/2022 11/29/16   [provider]  triamcinolone cream (KENALOG) 0.1 % Apply topically. Patient not taking: Reported on 06/12/2022 06/02/16    [provider]    Family History Family History  Problem Relation Age of Onset   Hypertension Mother    Heart murmur Mother    Celiac disease Mother    Hypertension Father    Heart attack Father    Transient ischemic attack Father    Transient ischemic attack Maternal Grandmother    CVA Paternal Grandmother    Hypertension Paternal Grandmother    Heart attack Paternal Grandfather    Hypertension Paternal Grandfather    Thyroid disease Sister    Hypertension Brother     Social History Social History   Tobacco Use   Smoking status: Former    Types: Cigarettes    Quit date: 2000    Years since quitting: 23.6  Vaping Use   Vaping Use: Never used  Substance Use Topics   Alcohol use: Yes    Comment: socially   Drug use: Never     Allergies   Fluogen [influenza virus vaccine], Macrobid [nitrofurantoin], Pyridium [phenazopyridine], Latex, Naltrexone, and Pylera [bis subcit-metronid-tetracyc]   Review of Systems Review of Systems Per HPI  Physical Exam Triage Vital Signs ED Triage Vitals  Enc Vitals Group     BP 08/10/22 1219 113/74     Pulse Rate 08/10/22 1219 68     Resp 08/10/22 1219 18     Temp 08/10/22 1219 98.9 F (37.2 C)     Temp Source 08/10/22 1219 Oral     SpO2 08/10/22 1219 98 %     Weight --      Height --      Head Circumference --      Peak Flow --      Pain Score 08/10/22 1216 2     Pain Loc --      Pain Edu? --      Excl. in GC? --    No data found.  Updated Vital Signs BP 113/74 (BP Location: Left Arm)   Pulse 68   Temp 98.9 F (37.2 C) (Oral)   Resp 18   SpO2 98%   Physical Exam Vitals and nursing note reviewed.  Constitutional:      General: She is not in acute distress. HENT:     Nose: Congestion present.     Mouth/Throat:     Mouth: Mucous membranes are moist.     Pharynx: Uvula midline. No posterior oropharyngeal erythema.     Tonsils: No tonsillar exudate or tonsillar abscesses.  Eyes:      Conjunctiva/sclera: Conjunctivae normal.     Pupils: Pupils are equal, round, and reactive to light.  Cardiovascular:     Rate and Rhythm: Normal rate and regular rhythm.     Pulses: Normal pulses.     Heart sounds: Normal heart sounds.  Pulmonary:     Effort: Pulmonary effort is normal. No respiratory distress.     Breath sounds: Normal breath sounds. No wheezing.  Abdominal:     Tenderness: There is no abdominal tenderness.  Musculoskeletal:  Cervical back: Normal range of motion.  Lymphadenopathy:     Cervical: Cervical adenopathy present.  Neurological:     Mental Status: She is alert and oriented to person, place, and time.      UC Treatments / Results  Labs (all labs ordered are listed, but only abnormal results are displayed) Labs Reviewed  SARS CORONAVIRUS 2 BY RT PCR    EKG   Radiology No results found.  Procedures Procedures (including critical care time)  Medications Ordered in UC Medications - No data to display  Initial Impression / Assessment and Plan / UC Course  I have reviewed the triage vital signs and the nursing notes.  Pertinent labs & imaging results that were available during my care of the patient were reviewed by me and considered in my medical decision making (see chart for details).  Covid test pending.  Would like antivirals if positive. GFR > 90, can take the regular dosing.  Continue symptomatic care. Work note provided. Return precautions discussed. Patient agrees to plan  Final Clinical Impressions(s) / UC Diagnoses   Final diagnoses:  Viral URI  Close exposure to COVID-19 virus     Discharge Instructions      We will call you if your covid test returns positive. If so, I will send the antivirals into your pharmacy.  I recommend calling your employee health regarding their COVID policy.  Continue symptomatic care at home and increase fluid intake.    ED Prescriptions   None    PDMP not reviewed this  encounter.   Danyella Mcginty, Lurena Joiner, [place] 08/10/22 1316

## 2022-08-10 NOTE — Discharge Instructions (Signed)
We will call you if your covid test returns positive. If so, I will send the antivirals into your pharmacy.  I recommend calling your employee health regarding their COVID policy.  Continue symptomatic care at home and increase fluid intake.

## 2022-08-10 NOTE — ED Triage Notes (Signed)
Pt was around her son on Monday and he tested COVID positive. She has cough, general fatigue, sore throat since yesterday. She took an at home test last night and it was neg. She is taking tylenol without relief.

## 2022-09-09 IMAGING — US US CAROTID DUPLEX BILAT
1 series · 13 of 24 positions shown · non-contrast
Comparison: None available

CLINICAL DATA: 46-year-old female with a history of carotid disease

EXAM:
BILATERAL CAROTID DUPLEX ULTRASOUND
TECHNIQUE: Gray scale imaging, color Doppler and duplex ultrasound were
performed of bilateral carotid and vertebral arteries in the neck.

[Series 1: us carotid duplex bilat · 0.05mm/px · 13 of 61 slices shown]
[im 1/61]
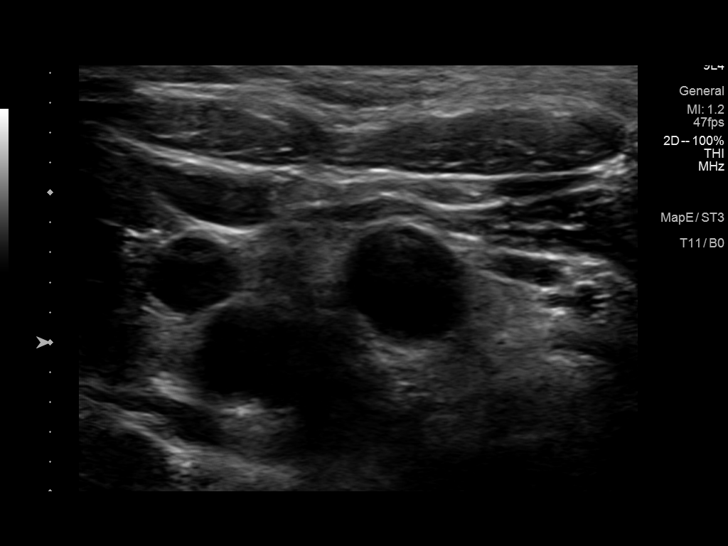
[im 6/61]
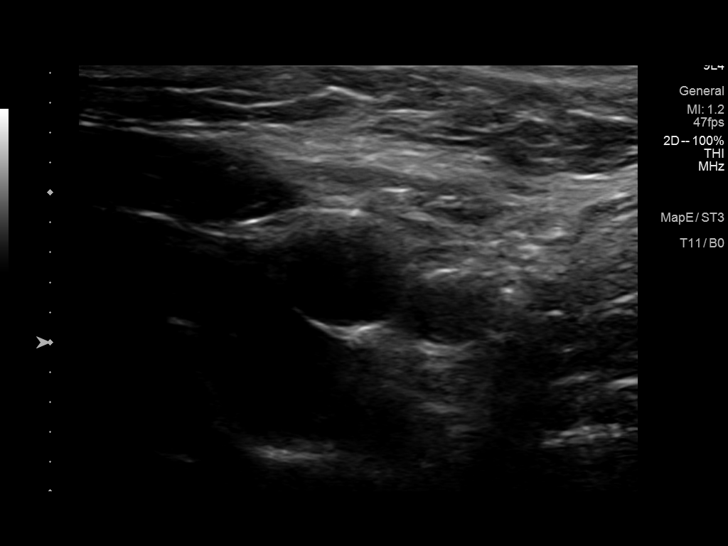
[im 11/61]
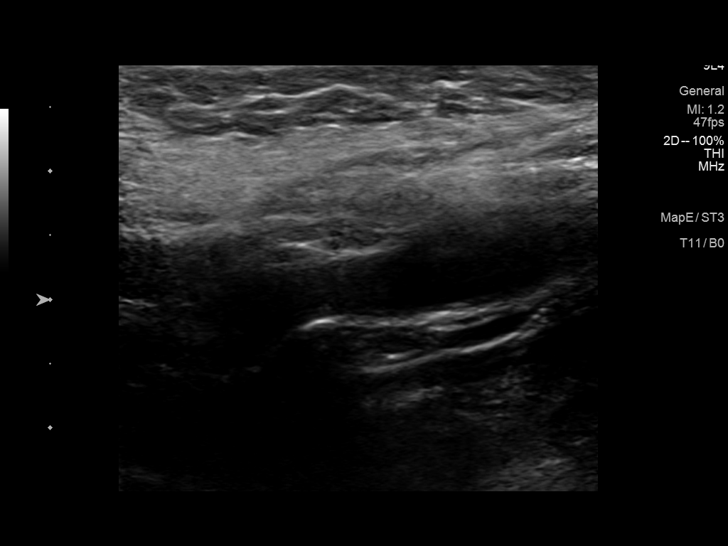
[im 16/61]
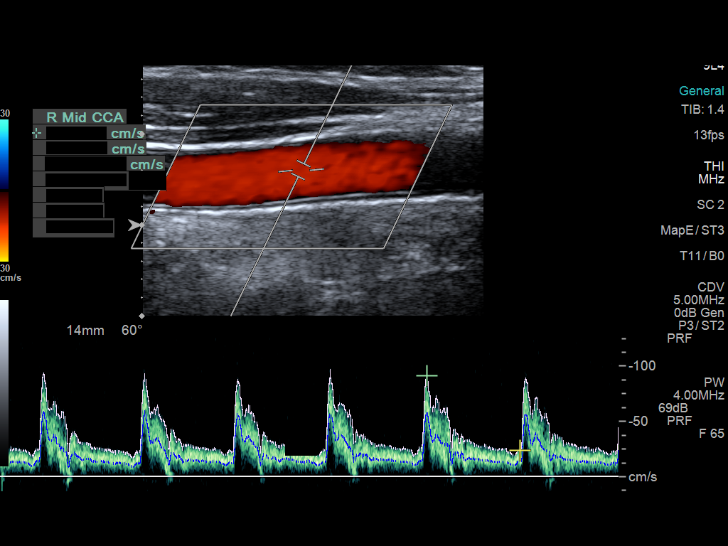
[im 21/61]
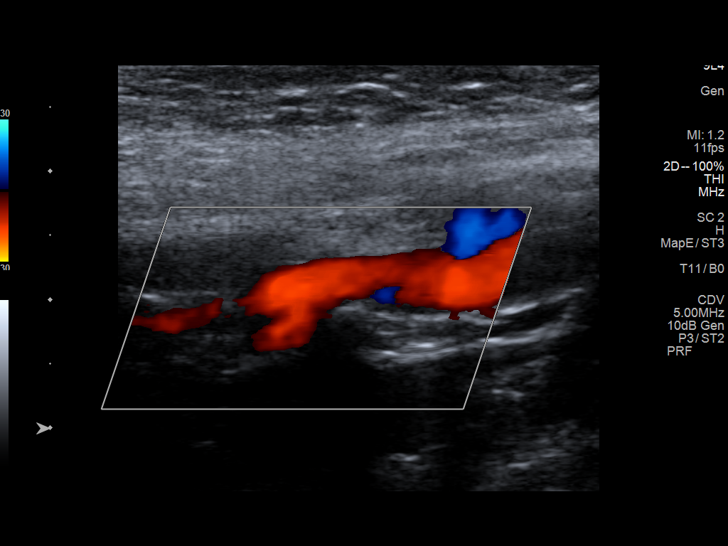
[im 27/61]
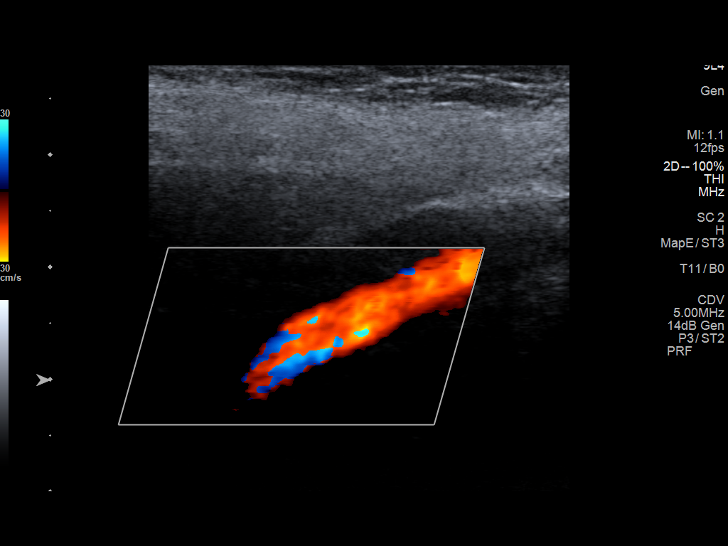
[im 32/61]
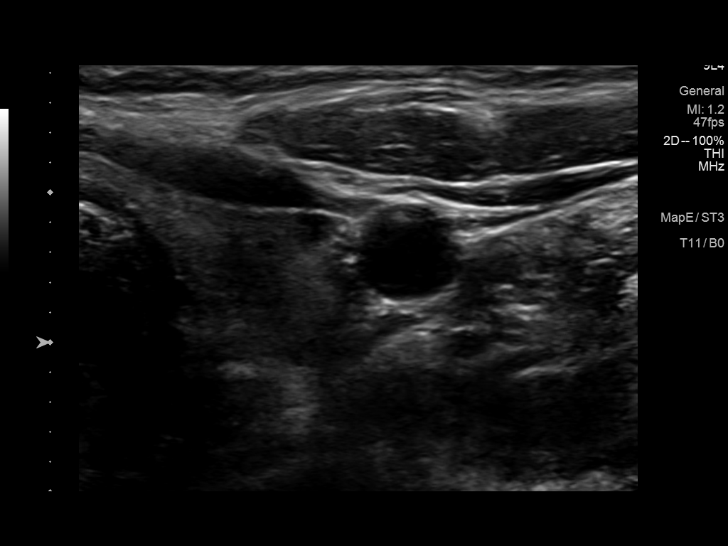
[im 34/61]
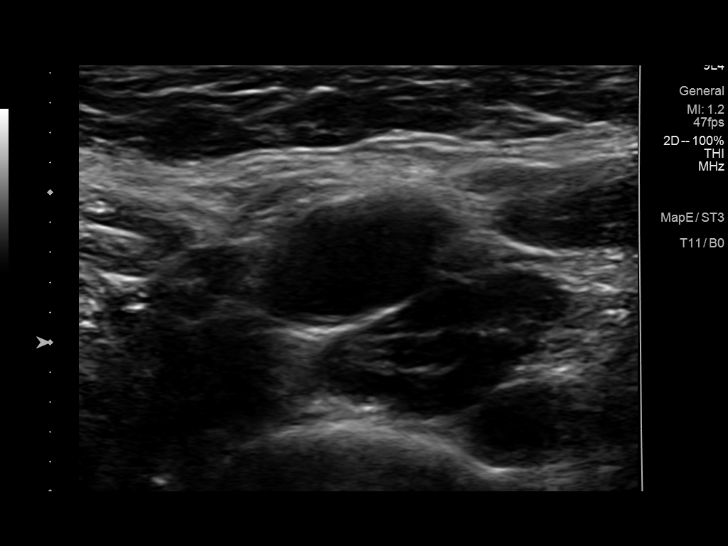
[im 40/61]
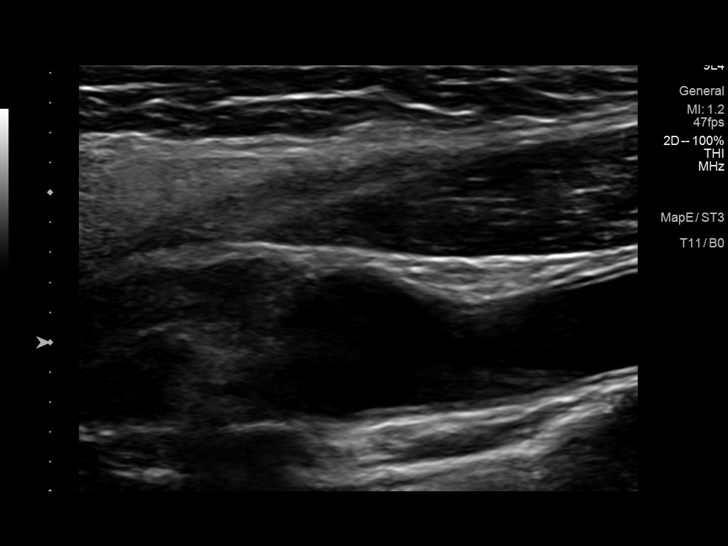
[im 45/61]
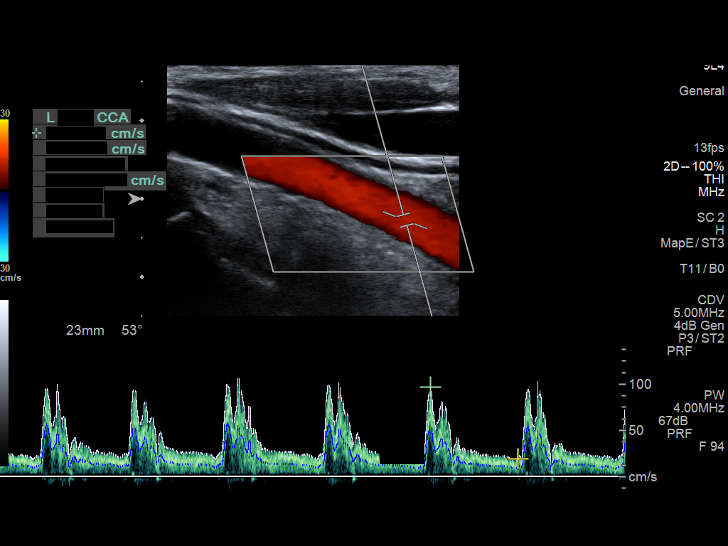
[im 50/61]
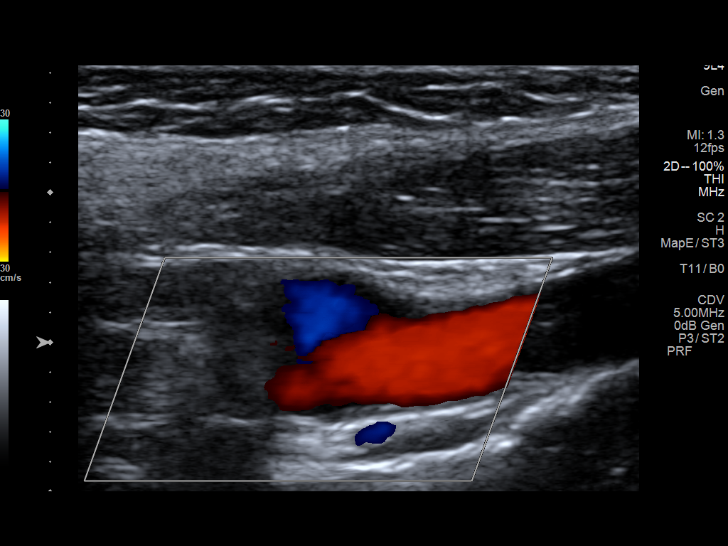
[im 55/61]
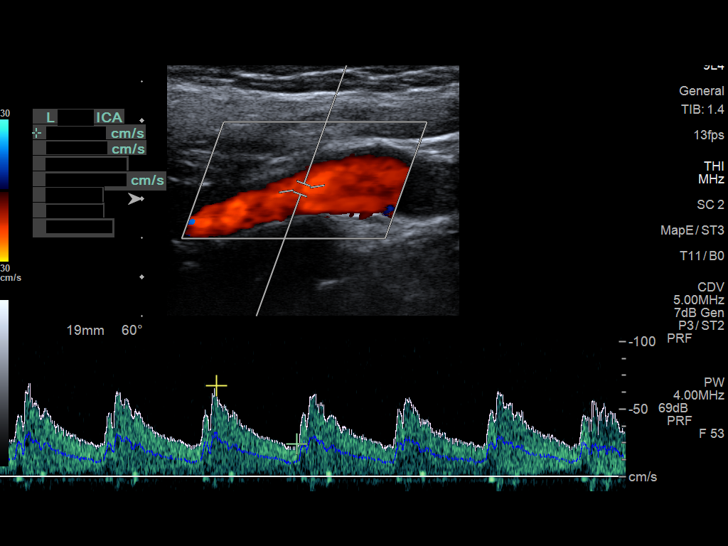
[im 61/61]
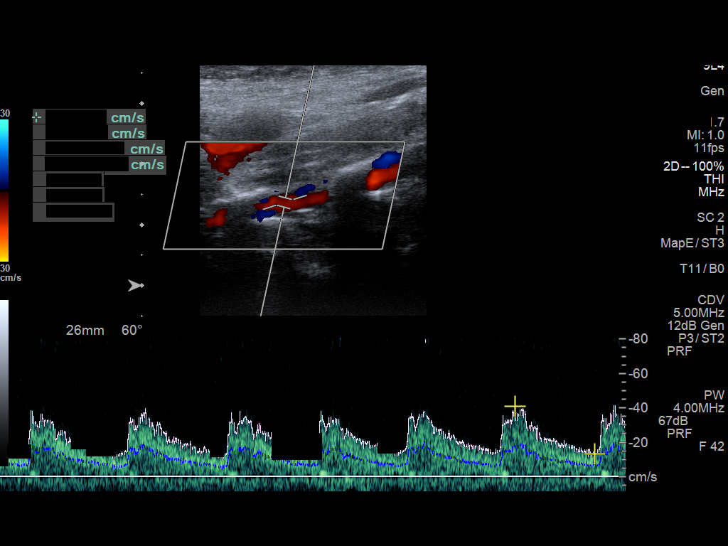

[13 of 24 positions shown; findings below may reference images not displayed]

FINDINGS: Criteria: Quantification of carotid stenosis is based on velocity
parameters that correlate the residual internal carotid diameter
with NASCET-based stenosis levels, using the diameter of the distal
internal carotid lumen as the denominator for stenosis measurement.

The following velocity measurements were obtained:

RIGHT

ICA:  Systolic 82 cm/sec, Diastolic 35 cm/sec

CCA:  99 cm/sec

SYSTOLIC ICA/CCA RATIO:

ECA:  89 cm/sec

LEFT

ICA:  Systolic 81 cm/sec, Diastolic 36 cm/sec

CCA:  97 cm/sec

SYSTOLIC ICA/CCA RATIO:

ECA:  55 cm/sec

Right Brachial SBP: 117

Left Brachial SBP: 117

RIGHT CAROTID ARTERY: No significant calcified disease of the right
common carotid artery. Intermediate waveform maintained.
Heterogeneous plaque without significant calcifications at the right
carotid bifurcation. Low resistance waveform of the right ICA. No
significant tortuosity.

RIGHT VERTEBRAL ARTERY: Antegrade flow with low resistance waveform.

LEFT CAROTID ARTERY: No significant calcified disease of the left
common carotid artery. Intermediate waveform maintained.
Heterogeneous plaque at the left carotid bifurcation without
significant calcifications. Low resistance waveform of the left ICA.

LEFT VERTEBRAL ARTERY:  Antegrade flow with low resistance waveform.
IMPRESSION: Color duplex indicates minimal heterogeneous plaque, with no
hemodynamically significant stenosis by duplex criteria in the
extracranial cerebrovascular circulation.

## 2022-09-14 ENCOUNTER — Ambulatory Visit
Admission: RE | Admit: 2022-09-14 | Discharge: 2022-09-14 | Disposition: A | Discharge status: Home / Self Care | Payer: 59 | Source: Ambulatory Visit | Attending: Urgent Care | Admitting: Urgent Care

## 2022-09-14 VITALS — BP 112/75 | HR 73 | Temp 98.1°F | Resp 16

## 2022-09-14 DIAGNOSIS — L255 Unspecified contact dermatitis due to plants, except food: Secondary | ICD-10-CM

## 2022-09-14 MED ORDER — PREDNISONE 20 MG PO TABS: ORAL_TABLET | ORAL | 0 refills | Status: DC

## 2022-09-14 MED ORDER — HYDROXYZINE HCL 25 MG PO TABS: 12.5000 mg | ORAL_TABLET | Freq: Three times a day (TID) | ORAL | 0 refills | Status: DC | PRN

## 2022-09-14 NOTE — ED Provider Notes (Signed)
[facility]  Note:  This document was prepared using Systems analyst and may include unintentional dictation errors.  MRN: [id number] DOB: 27-Apr-1976  Subjective:   Wendy Spencer is a 46 y.o. female presenting for 1 week history of persistent pruritic spots over her extremities.  Patient was handling brush at her home and found that it was poison ivy.  She has been using Benadryl and hydrocortisone cream with minimal relief.  No facial swelling, oral swelling.  No chest tightness.  No concern for tick bites.  No current facility-administered medications for this encounter.  Current Outpatient Medications:    ALPRAZolam (XANAX) 0.25 MG tablet, Take 0.25 mg by mouth 3 (three) times daily as needed for anxiety., Disp: , Rfl:    DEXILANT 60 MG capsule, Take 1 capsule by mouth daily. (Patient not taking: Reported on 06/12/2022), Disp: , Rfl:    diclofenac Sodium (VOLTAREN) 1 % GEL, Apply topically., Disp: , Rfl:    diphenhydrAMINE (BENADRYL) 25 mg capsule, Take by mouth., Disp: , Rfl:    DULoxetine (CYMBALTA) 20 MG capsule, Take 20 mg by mouth daily. (Patient not taking: Reported on 06/12/2022), Disp: , Rfl:    EPINEPHrine 0.3 mg/0.3 mL IJ SOAJ injection, Inject 0.3 mg into the muscle as needed for anaphylaxis., Disp: , Rfl:    famotidine (PEPCID) 20 MG tablet, Take by mouth. (Patient not taking: Reported on 06/12/2022), Disp: , Rfl:    fluconazole (DIFLUCAN) 150 MG tablet, Take 150 mg by mouth once., Disp: , Rfl:    hydroxychloroquine (PLAQUENIL) 200 MG tablet, Take by mouth., Disp: , Rfl:    naltrexone (DEPADE) 50 MG tablet, Take by mouth. (Patient not taking: Reported on 06/12/2022), Disp: , Rfl:    ondansetron (ZOFRAN-ODT) 4 MG disintegrating tablet, Take 4 mg by mouth every 8 (eight) hours as needed., Disp: , Rfl:    ondansetron (ZOFRAN-ODT) 4 MG disintegrating tablet, Take by mouth., Disp: , Rfl:    pantoprazole (PROTONIX) 40 MG tablet, Take 1  tablet by mouth daily., Disp: , Rfl:    predniSONE (DELTASONE) 5 MG tablet, Take by mouth. (Patient not taking: Reported on 06/12/2022), Disp: , Rfl:    senna-docusate (SENOKOT-S) 8.6-50 MG tablet, Take 1 tablet by mouth 2 (two) times daily. (Patient not taking: Reported on 06/12/2022), Disp: , Rfl:    triamcinolone cream (KENALOG) 0.1 %, Apply topically. (Patient not taking: Reported on 06/12/2022), Disp: , Rfl:    valACYclovir (VALTREX) 1000 MG tablet, Take 4,000 mg by mouth once., Disp: , Rfl:    Allergies  Allergen Reactions   Fluogen [Influenza Virus Vaccine] Shortness Of Breath and Rash   Macrobid [Nitrofurantoin] Shortness Of Breath   Pyridium [Phenazopyridine] Shortness Of Breath   Latex Dermatitis    Other reaction(s): Contact Dermatitis (intolerance)   Naltrexone Hives   Pylera [Bis Subcit-Metronid-Tetracyc] Itching    Past Medical History:  Diagnosis Date   Anemia    Anxiety    Depression    Fibromyalgia    GERD (gastroesophageal reflux disease)    Hereditary hemochromatosis ([place])    Insomnia    Rosacea    Systemic lupus erythematosus ([place])    Vitamin D deficiency      Past Surgical History:  Procedure Laterality Date   CHOLECYSTECTOMY  2008   DILATION AND CURETTAGE OF UTERUS  1994   LAPAROSCOPIC GASTRIC SLEEVE RESECTION  2017    Family History  Problem Relation Age of Onset   Hypertension Mother  Heart murmur Mother    Celiac disease Mother    Hypertension Father    Heart attack Father    Transient ischemic attack Father    Transient ischemic attack Maternal Grandmother    CVA Paternal Grandmother    Hypertension Paternal Grandmother    Heart attack Paternal Grandfather    Hypertension Paternal Grandfather    Thyroid disease Sister    Hypertension Brother     Social History   Tobacco Use   Smoking status: Former    Types: Cigarettes    Quit date: 2000    Years since quitting: 23.7  Vaping Use   Vaping Use: Never used  Substance Use Topics    Alcohol use: Yes    Comment: socially   Drug use: Never    ROS   Objective:   Vitals: BP 112/75 (BP Location: Left Arm)   Pulse 73   Temp 98.1 F (36.7 C) (Oral)   Resp 16   SpO2 98%   Physical Exam Constitutional:      General: She is not in acute distress.    Appearance: Normal appearance. She is well-developed. She is not ill-appearing, toxic-appearing or diaphoretic.  HENT:     Head: Normocephalic and atraumatic.     Comments: No facial swelling.    Nose: Nose normal.     Mouth/Throat:     Mouth: Mucous membranes are moist.     Pharynx: No oropharyngeal exudate or posterior oropharyngeal erythema.     Comments: Airway is patent.  Patient is controlling secretions. Eyes:     General: No scleral icterus.       Right eye: No discharge.        Left eye: No discharge.     Extraocular Movements: Extraocular movements intact.  Cardiovascular:     Rate and Rhythm: Normal rate.  Pulmonary:     Effort: Pulmonary effort is normal.  Skin:    General: Skin is warm and dry.     Findings: Rash (multiple macular erythematic pruritic lesions scattered over the extremities only) present.  Neurological:     General: No focal deficit present.     Mental Status: She is alert and oriented to person, place, and time.  Psychiatric:        Mood and Affect: Mood normal.        Behavior: Behavior normal.     Assessment and Plan :   PDMP not reviewed this encounter.  1. Rhus dermatitis     Recommended oral prednisone course with hydroxyzine for less dermatitis.  Use supportive care otherwise. Counseled patient on potential for adverse effects with medications prescribed/recommended today, ER and return-to-clinic precautions discussed, patient verbalized understanding.    Wallis Bamberg, [place] 09/14/22 1906

## 2022-09-14 NOTE — ED Triage Notes (Signed)
Pt reports having poison ivy all over her body. She is using benadryl and hydrocortisone cream.

## 2022-10-12 ENCOUNTER — Emergency Department (HOSPITAL_BASED_OUTPATIENT_CLINIC_OR_DEPARTMENT_OTHER): Payer: 59

## 2022-10-12 ENCOUNTER — Encounter (HOSPITAL_BASED_OUTPATIENT_CLINIC_OR_DEPARTMENT_OTHER): Payer: Self-pay | Admitting: Emergency Medicine

## 2022-10-12 ENCOUNTER — Other Ambulatory Visit: Payer: Self-pay

## 2022-10-12 ENCOUNTER — Emergency Department (HOSPITAL_BASED_OUTPATIENT_CLINIC_OR_DEPARTMENT_OTHER)
Admission: EM | Admit: 2022-10-12 | Discharge: 2022-10-12 | Disposition: A | Discharge status: Home / Self Care | Payer: 59 | Attending: Emergency Medicine | Admitting: Emergency Medicine

## 2022-10-12 DIAGNOSIS — Z79899 Other long term (current) drug therapy: Secondary | ICD-10-CM | POA: Insufficient documentation

## 2022-10-12 DIAGNOSIS — Z91040 Latex allergy status: Secondary | ICD-10-CM | POA: Insufficient documentation

## 2022-10-12 DIAGNOSIS — R0789 Other chest pain: Secondary | ICD-10-CM | POA: Diagnosis not present

## 2022-10-12 DIAGNOSIS — R0602 Shortness of breath: Secondary | ICD-10-CM | POA: Insufficient documentation

## 2022-10-12 DIAGNOSIS — Z20822 Contact with and (suspected) exposure to covid-19: Secondary | ICD-10-CM | POA: Diagnosis not present

## 2022-10-12 DIAGNOSIS — R059 Cough, unspecified: Secondary | ICD-10-CM | POA: Insufficient documentation

## 2022-10-12 LAB — RESP PANEL BY RT-PCR (FLU A&B, COVID) ARPGX2
Influenza A by PCR: NEGATIVE
Influenza B by PCR: NEGATIVE
SARS Coronavirus 2 by RT PCR: NEGATIVE

## 2022-10-12 LAB — CBC
HCT: 43 % (ref 36.0–46.0)
Hemoglobin: 14.6 g/dL (ref 12.0–15.0)
MCH: 31.3 pg (ref 26.0–34.0)
MCHC: 34 g/dL (ref 30.0–36.0)
MCV: 92.3 fL (ref 80.0–100.0)
Platelets: 331 10*3/uL (ref 150–400)
RBC: 4.66 MIL/uL (ref 3.87–5.11)
RDW: 12.9 % (ref 11.5–15.5)
WBC: 11.6 10*3/uL — ABNORMAL HIGH (ref 4.0–10.5)
nRBC: 0 % (ref 0.0–0.2)

## 2022-10-12 LAB — BASIC METABOLIC PANEL
Anion gap: 7 (ref 5–15)
BUN: 16 mg/dL (ref 6–20)
CO2: 23 mmol/L (ref 22–32)
Calcium: 8.7 mg/dL — ABNORMAL LOW (ref 8.9–10.3)
Chloride: 106 mmol/L (ref 98–111)
Creatinine, Ser: 0.92 mg/dL (ref 0.44–1.00)
GFR, Estimated: >60 mL/min (ref >60)
Glucose, Bld: 96 mg/dL (ref 70–99)
Potassium: 3.3 mmol/L — ABNORMAL LOW (ref 3.5–5.1)
Sodium: 136 mmol/L (ref 135–145)

## 2022-10-12 MED ORDER — IOHEXOL 350 MG/ML SOLN
75.0000 mL | Freq: Once | INTRAVENOUS | Status: AC | PRN
  Administered 2022-10-12: 75 mL via INTRAVENOUS

## 2022-10-12 MED ORDER — LORATADINE 10 MG PO TABS: 10.0000 mg | ORAL_TABLET | Freq: Every day | ORAL | 0 refills | Status: DC

## 2022-10-12 MED ORDER — GUAIFENESIN-CODEINE 100-10 MG/5ML PO SOLN: 5.0000 mL | Freq: Three times a day (TID) | ORAL | 0 refills | Status: DC | PRN

## 2022-10-12 MED ORDER — ALBUTEROL SULFATE HFA 108 (90 BASE) MCG/ACT IN AERS: 1.0000 | INHALATION_SPRAY | Freq: Four times a day (QID) | RESPIRATORY_TRACT | 0 refills | Status: DC | PRN

## 2022-10-12 MED ORDER — SODIUM CHLORIDE 0.9 % IV BOLUS
1000.0000 mL | Freq: Once | INTRAVENOUS | Status: AC
  Administered 2022-10-12: 1000 mL via INTRAVENOUS

## 2022-10-12 MED ORDER — AEROCHAMBER PLUS FLO-VU MEDIUM MISC
1.0000 | Freq: Once | Status: AC
  Administered 2022-10-12: 1
  Filled 2022-10-12: qty 1

## 2022-10-12 MED ORDER — ALBUTEROL SULFATE HFA 108 (90 BASE) MCG/ACT IN AERS: 2.0000 | INHALATION_SPRAY | RESPIRATORY_TRACT | Status: DC | PRN

## 2022-10-12 NOTE — ED Notes (Signed)
Ambulated to exam room 5, noted to have hoarseness and coughing upon ambulation, appeared to have demonstrated DOE. VS reassessed, placed in pt gown, placed in [place].

## 2022-10-12 NOTE — ED Triage Notes (Signed)
Flu A x 2 weeks , persistent shortness of breath , had chest x ray today .  Mid chest pain with coughing , Feels no relief despite all treatment she got  she said .

## 2022-10-12 NOTE — ED Notes (Signed)
Wendy Spencer has a new Albuterol MDI that was filled on Tuesday of this week.  She was given a spacer to use with her MDI and instructed on how to use it.

## 2022-10-12 NOTE — ED Provider Notes (Signed)
[facility] Provider Note   CSN: [id number] Arrival date & time: 10/12/22  1023     History  Chief Complaint  Patient presents with   Shortness of Breath    Wendy Spencer is a 46 y.o. female with a history of hereditary hemochromatosis, SLE, fibromyalgia, anxiety, depression, OSA, and GERD.  Presenting to the ED today with complaints of shortness of breath.  Reports having tested positive for influenza A with associated symptoms of the last 2 weeks.  Reporting mild chest tenderness with coughing.  Had some N/V on Wednesday and has been using Zofran for help.  Reports using inhaler every 4 hours with minimal relief.  With low-grade intermittent fevers.  Cough described as productive.  Patient's significant other tested positive for covid this AM.  Seen by [facility] this morning and recommended to come to ED for eval of possible PE.    Per personal review of records, first seen for symptoms on 10/03/2022 at [facility], symptoms that started 3 days prior and consisted of nasal congestion, chills, cough, sore throat.  Tested positive for influenza A that visit, was started on Tamiflu and Bromfed.  Seen then again Tuesday 10/24 and started on Augmentin and steroid pack for sinus infection symptoms.    The history is provided by the patient and medical records.  Shortness of Breath      Home Medications Prior to Admission medications   Medication Sig Start Date End Date Taking? Authorizing Provider  albuterol (VENTOLIN HFA) 108 (90 Base) MCG/ACT inhaler Inhale 1-2 puffs into the lungs every 6 (six) hours as needed for wheezing or shortness of breath. 10/12/22  Yes Prince Rome, PA-C  guaiFENesin-codeine 100-10 MG/5ML syrup Take 5 mLs by mouth 3 (three) times daily as needed for cough. 10/12/22  Yes Prince Rome, PA-C  loratadine (CLARITIN) 10 MG tablet Take 1 tablet (10 mg total) by mouth daily. 10/12/22  Yes Prince Rome, PA-C  ALPRAZolam  Duanne Moron) 0.25 MG tablet Take 0.25 mg by mouth 3 (three) times daily as needed for anxiety.    [provider]  DEXILANT 60 MG capsule Take 1 capsule by mouth daily. Patient not taking: Reported on 06/12/2022 11/23/20   [provider]  diclofenac Sodium (VOLTAREN) 1 % GEL Apply topically. 09/29/19   [provider]  diphenhydrAMINE (BENADRYL) 25 mg capsule Take by mouth.    [provider]  DULoxetine (CYMBALTA) 20 MG capsule Take 20 mg by mouth daily. Patient not taking: Reported on 06/12/2022 11/23/20   [provider]  EPINEPHrine 0.3 mg/0.3 mL IJ SOAJ injection Inject 0.3 mg into the muscle as needed for anaphylaxis.    [provider]  famotidine (PEPCID) 20 MG tablet Take by mouth. Patient not taking: Reported on 06/12/2022 09/06/18   [provider]  fluconazole (DIFLUCAN) 150 MG tablet Take 150 mg by mouth once. 05/08/21   [provider]  hydroxychloroquine (PLAQUENIL) 200 MG tablet Take by mouth. 12/20/15   [provider]  hydrOXYzine (ATARAX) 25 MG tablet Take 0.5-1 tablets (12.5-25 mg total) by mouth every 8 (eight) hours as needed for itching. 09/14/22   Jaynee Eagles, PA-C  naltrexone (DEPADE) 50 MG tablet Take by mouth. Patient not taking: Reported on 06/12/2022    [provider]  ondansetron (ZOFRAN-ODT) 4 MG disintegrating tablet Take 4 mg by mouth every 8 (eight) hours as needed. 08/26/20   [provider]  ondansetron (ZOFRAN-ODT) 4 MG disintegrating tablet Take by mouth.  [provider]  pantoprazole (PROTONIX) 40 MG tablet Take 1 tablet by mouth daily. 03/09/21   [provider]  predniSONE (DELTASONE) 20 MG tablet Day 1-7: Take 2 tablets daily. Day 8-14: Take 1 tablet daily. Take medications with breakfast. 09/14/22   Wallis Bamberg, PA-C  senna-docusate (SENOKOT-S) 8.6-50 MG tablet Take 1 tablet by mouth 2 (two) times daily. Patient not taking: Reported on 06/12/2022  11/29/16   [provider]  triamcinolone cream (KENALOG) 0.1 % Apply topically. Patient not taking: Reported on 06/12/2022 06/02/16   [provider]  valACYclovir (VALTREX) 1000 MG tablet Take 4,000 mg by mouth once. 03/22/21   [provider]      Allergies    Fluogen [influenza virus vaccine], Macrobid [nitrofurantoin], Pyridium [phenazopyridine], Latex, Naltrexone, and Pylera [bis subcit-metronid-tetracyc]    Review of Systems   Review of Systems  Respiratory:  Positive for shortness of breath.     Physical Exam Updated Vital Signs BP 114/80   Pulse 81   Temp 98.5 F (36.9 C)   Resp 18   Ht 5\' 4"  (1.626 m)   Wt 82.1 kg   SpO2 100%   BMI 31.07 kg/m  Physical Exam Vitals and nursing note reviewed.  Constitutional:      General: She is not in acute distress.    Appearance: She is well-developed. She is ill-appearing (Mildly). She is not toxic-appearing or diaphoretic.  HENT:     Head: Normocephalic and atraumatic.     Right Ear: Tympanic membrane, ear canal and external ear normal.     Left Ear: Tympanic membrane, ear canal and external ear normal.     Nose: Congestion present.     Mouth/Throat:     Mouth: Mucous membranes are moist.     Tongue: Tongue does not deviate from midline.     Palate: No mass and lesions.     Pharynx: Oropharynx is clear. Uvula midline. Posterior oropharyngeal erythema (Minimal) present. No oropharyngeal exudate or uvula swelling.     Tonsils: No tonsillar exudate or tonsillar abscesses.  Eyes:     Conjunctiva/sclera: Conjunctivae normal.  Cardiovascular:     Rate and Rhythm: Normal rate and regular rhythm.     Heart sounds: No murmur heard. Pulmonary:     Effort: Pulmonary effort is normal. No respiratory distress.     Breath sounds: Normal breath sounds.     Comments: Communicates clearly without difficulty, Very mild wheeze globally, moving air well, no accessory muscle involvement or tachypnea.  No  stridor. Chest:     Chest wall: Tenderness (Mild central chest wall) present.  Abdominal:     Palpations: Abdomen is soft.     Tenderness: There is no abdominal tenderness.  Musculoskeletal:        General: No swelling.     Cervical back: Neck supple.  Skin:    General: Skin is warm and dry.     Capillary Refill: Capillary refill takes less than 2 seconds.  Neurological:     Mental Status: She is alert.  Psychiatric:        Mood and Affect: Mood normal.     ED Results / Procedures / Treatments   Labs (all labs ordered are listed, but only abnormal results are displayed) Labs Reviewed  BASIC METABOLIC PANEL - Abnormal; Notable for the following components:      Result Value   Potassium 3.3 (*)    Calcium 8.7 (*)    All other components within normal limits  CBC - Abnormal; Notable for the following components:   WBC 11.6 (*)    All other components within normal limits  RESP PANEL BY RT-PCR (FLU A&B, COVID) ARPGX2  PREGNANCY, URINE    EKG EKG Interpretation  Date/Time:  Friday October 12 2022 10:30:40 EDT Ventricular Rate:  70 PR Interval:  188 QRS Duration: 94 QT Interval:  370 QTC Calculation: 399 R Axis:   35 Text Interpretation: Normal sinus rhythm Normal ECG No previous ECGs available Confirmed by Elayne Snare ([id number]) on 10/12/2022 5:43:43 PM  Radiology CT Angio Chest PE W/Cm &/Or Wo Cm  Result Date: 10/12/2022 CLINICAL DATA:  Influenza type a for 2 weeks, persistent shortness of breath, mid chest pain and coughing EXAM: CT ANGIOGRAPHY CHEST WITH CONTRAST TECHNIQUE: Multidetector CT imaging of the chest was performed using the standard protocol during bolus administration of intravenous contrast. Multiplanar CT image reconstructions and MIPs were obtained to evaluate the vascular anatomy. RADIATION DOSE REDUCTION: This exam was performed according to the departmental dose-optimization program which includes automated exposure control, adjustment of the mA  and/or kV according to patient size and/or use of iterative reconstruction technique. CONTRAST:  72mL OMNIPAQUE IOHEXOL 350 MG/ML SOLN COMPARISON:  10/12/2022 FINDINGS: Cardiovascular: This is a technically adequate evaluation of the pulmonary vasculature. No filling defects or pulmonary emboli. The heart is unremarkable without pericardial effusion. No evidence of thoracic aortic aneurysm or dissection. Mediastinum/Nodes: No enlarged mediastinal, hilar, or axillary lymph nodes. Thyroid gland, trachea, and esophagus demonstrate no significant findings. Small hiatal hernia. Lungs/Pleura: No acute airspace disease, effusion, or pneumothorax. Central airways are patent. Upper Abdomen: Hepatic steatosis. No acute upper abdominal finding. Postsurgical changes from cholecystectomy and bariatric surgery. Musculoskeletal: No acute or destructive bony lesions. Reconstructed images demonstrate no additional findings. Review of the MIP images confirms the above findings. IMPRESSION: 1. No evidence of pulmonary embolus. 2. No acute intrathoracic process. 3. Small hiatal hernia. 4. Hepatic steatosis. Electronically Signed   By: Sharlet Salina M.D.   On: 10/12/2022 16:31   DG Chest 2 View  Result Date: 10/12/2022 CLINICAL DATA:  Shortness of breath and cough. Influenza 2 weeks ago. Vomiting. EXAM: CHEST - 2 VIEW COMPARISON:  03/16/2016 FINDINGS: Postoperative clips in the expected vicinity of the stomach. The lungs appear clear. Cardiac and mediastinal contours normal. No pleural effusion identified. Mild upper thoracic spondylosis. IMPRESSION: 1. No active cardiopulmonary disease is radiographically apparent. 2. Mild upper thoracic spondylosis. Electronically Signed   By: Gaylyn Rong M.D.   On: 10/12/2022 11:39    Procedures Procedures    Medications Ordered in ED Medications  albuterol (VENTOLIN HFA) 108 (90 Base) MCG/ACT inhaler 2 puff (has no administration in time range)  sodium chloride 0.9 % bolus  1,000 mL (0 mLs Intravenous Stopped 10/12/22 1711)  AeroChamber Plus Flo-Vu Medium MISC 1 each (1 each Other Given 10/12/22 1530)  iohexol (OMNIPAQUE) 350 MG/ML injection 75 mL (75 mLs Intravenous Contrast Given 10/12/22 1626)    ED Course/ Medical Decision Making/ A&P                           Medical Decision Making Amount and/or Complexity of Data Reviewed Labs: ordered. Radiology: ordered.  Risk OTC drugs. Prescription drug management.   46 y.o. female presents to the ED for concern of Shortness of Breath   This involves an extensive number of treatment options, and is a complaint that carries with it a high risk of complications and morbidity.  The emergent differential diagnosis prior to evaluation includes, but is not limited to: Viral pharyngitis, pneumonia, pulmonary embolism, postviral cough syndrome  This is not an exhaustive differential.   Past Medical History / Co-morbidities / Social History: Hx of hereditary hemochromatosis, SLE, fibromyalgia, anxiety, depression, OSA, and GERD Social Determinants of Health include: None  Additional History:  Obtained by chart review.  Notably recent UC notes, see for details.  Lab Tests: I ordered, and personally interpreted labs.  The pertinent results include:   Potassium 3.3, mild WBC 11.6, nonspecific Covid and flu negative  Imaging Studies: I ordered imaging studies including CXR and CTA PE study .   I independently visualized and interpreted imaging which showed  CXR: No evidence of acute cardiopulmonary pathology CTA PE study: 1. No evidence of pulmonary embolus. 2. No acute intrathoracic process. 3. Small hiatal hernia. 4. Hepatic steatosis. I agree with the radiologist interpretation.  ED Course: Pt well-appearing on exam.  Nontoxic nonseptic appearing in NAD. Presenting with continued cough and shortness of breath.  Initially tested positive for Influenza A 2 weeks ago.  Some improvement of symptoms since onset,  however cough appears to remain.  Now with 1-2 episodes of N/V with coughing fit per pt.  Not receiving relief from inhaler every 4 hours.  Subjective low-grade intermittent fevers.  Afebrile today.  Reports spouse testing positive for Covid this morning.  Recommended by UC to come to ED for evaluation of possible PE.  Albuterol and fluid bolus ordered. Per review of records, initially placed on Tamilfu and Bromfed at symptom onset 10/03/22.  Seen again on 10/24 and started on Augmentin and steroid pack for sinus infection.   On exam, with mild wheeze though still with adequate air movement appreciated.  No evidence of respiratory distress.  No tripoding, pale appearance, or cyanosis.  Satting between 98-100%.  ABCs intact.  Handing secretions well.  Exam nonconcerning for RPA/PTA.  No angioedema.  Very mild erythema of posterior oropharynx and mild congestion.  Covid and flu negative today.  Exam provides low suspicion for bacterial pharyngitis.  No hx of DVT or PE.  No hemoptysis, clinical evidence of DVT, recent surgery, or hx of malignancy.  CXR unremarkable.  Though with pt continued symptoms and UC concern for PE, plan to proceed with CTA chest for further evaluation. CT imaging negative for PE.  Also negative for pneumonia or acute intrathoracic process/pathology.   Upon re-evaluation, pt with notable and reported improvement.  Likely post-viral cough syndrome in nature.  Overall, I am uncertain the exact etiology of the patient's symptoms.  However, I do not believe she is currently experiencing a medical, surgical, or psychiatric emergency.  Passed PO challenge.  Mild central chest wall tenderness likely due to prolonged cough.  Again no evidence of respiratory distress.  Ambulates without evidence of hypoxia.  Using MDI albuterol at home without aerochamber.  Provided aerochamber for improved delivery of medication at home.  Recommend continued conservative management with pulmonology follow up.   Prescription for claritin, albuterol, and guaifenesin-codeine sent to pharmacy.  Strict return precautions discussed.  Pt reports satisfaction with today's encounter.  Patient in NAD and in good condition at time of discharge.  Disposition: After consideration the patient's encounter today, I do not feel today's workup suggests an emergent condition requiring admission or immediate intervention beyond what has been performed at this time.  Safe for discharge; instructed to return immediately for worsening symptoms, change in symptoms or any other concerns.  I have  reviewed the patients home medicines and have made adjustments as needed.  Discussed course of treatment with the patient, whom demonstrated understanding.  Patient in agreement and has no further questions.    I discussed this case with my attending physician Dr. Theresia Lo, who agreed with the proposed treatment course and cosigned this note.  Attending physician stated agreement with plan or made changes to plan which were implemented.     This chart was dictated using voice recognition software.  Despite best efforts to proofread, errors can occur which can change the documentation meaning.         Final Clinical Impression(s) / ED Diagnoses Final diagnoses:  SOB (shortness of breath)  Cough, unspecified type    Rx / DC Orders ED Discharge Orders          Ordered    guaiFENesin-codeine 100-10 MG/5ML syrup  3 times daily PRN        10/12/22 1825    loratadine (CLARITIN) 10 MG tablet  Daily        10/12/22 1825    albuterol (VENTOLIN HFA) 108 (90 Base) MCG/ACT inhaler  Every 6 hours PRN        10/12/22 1825              Cecil Cobbs, PA-C 10/19/22 1651    Gwyneth Sprout, MD 10/20/22 1150

## 2022-10-12 NOTE — ED Notes (Signed)
Patient transported to CT 

## 2022-10-12 NOTE — Discharge Instructions (Addendum)
You were evaluated in the emergency department today due to shortness of breath and continued cough.  The tests that were completed in the emergency department did not show an explanation for your symptoms.  Imaging did not show evidence of a PE, pneumonia, or fluid buildup.  Your symptoms are likely due to postviral cough syndrome.  Continue symptom management until you are able to follow-up with pulmonology.  I have provided you with the contact information for [facility], please call first thing Monday morning to schedule a follow-up appointment.  Claritin, cough medicine, and albuterol prescriptions have been sent to your pharmacy.  You may pick these up today.  Take the Claritin daily for the next 2 weeks.  Use the albuterol every 6 hours as needed.  Cough medicine is every 8 hours as needed.  I would encourage you to take the medicines as prescribed and return to your doctor for reevaluation in the next 2 days for a recheck if still having symptoms.  Return to the ED for new or worsening symptoms.

## 2022-10-15 ENCOUNTER — Ambulatory Visit: Payer: 59 | Admitting: Pulmonary Disease

## 2022-10-15 ENCOUNTER — Encounter: Payer: Self-pay | Admitting: Pulmonary Disease

## 2022-10-15 VITALS — BP 120/78 | HR 76 | Temp 97.6°F | Ht 64.0 in | Wt 185.0 lb

## 2022-10-15 DIAGNOSIS — R052 Subacute cough: Secondary | ICD-10-CM

## 2022-10-15 MED ORDER — PREDNISONE 10 MG PO TABS: ORAL_TABLET | ORAL | 0 refills | Status: DC

## 2022-10-15 NOTE — Patient Instructions (Signed)
Use the cough medicine you have at night mostly at night to help get some sleep  N-acetylcysteine-it is over-the-counter, 600 mg p.o. twice a day You can add Mucinex during the day as needed  Prescription for prednisone, can stop this after about 6 to 7 days  Call with significant concerns  I will see you in 6 to 8 weeks

## 2022-10-15 NOTE — Progress Notes (Signed)
Wendy Spencer    [id number]    Oct 06, 1976  Primary Care Physician:Prochnau, Chrys Racer, MD  Referring Physician: Ernestene Kiel, MD [address] 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   ALPRAZolam (XANAX) 0.25 MG tablet Take 0.25 mg by mouth 3 (three) times daily as needed for anxiety.   DEXILANT 60 MG capsule Take 1 capsule by mouth daily. (Patient not taking: Reported on 06/12/2022)   diclofenac Sodium (VOLTAREN) 1 % GEL Apply topically.   diphenhydrAMINE (BENADRYL) 25 mg capsule Take by mouth.   DULoxetine (CYMBALTA) 20 MG capsule Take 20 mg by mouth daily. (Patient not taking: Reported on 06/12/2022)   EPINEPHrine 0.3 mg/0.3 mL IJ SOAJ injection Inject 0.3 mg into the muscle as needed for anaphylaxis.   famotidine (PEPCID) 20 MG tablet Take by mouth. (Patient not taking: Reported on 06/12/2022)   fluconazole (DIFLUCAN) 150 MG tablet Take 150 mg by mouth once.   guaiFENesin-codeine 100-10 MG/5ML syrup Take 5 mLs by mouth 3 (three) times daily as needed for cough.   hydroxychloroquine (PLAQUENIL) 200 MG tablet Take by mouth.   hydrOXYzine (ATARAX) 25 MG tablet Take 0.5-1 tablets (12.5-25 mg total) by mouth every 8 (eight) hours as  needed for itching.   loratadine (CLARITIN) 10 MG tablet Take 1 tablet (10 mg total) by mouth daily.   naltrexone (DEPADE) 50 MG tablet Take by mouth. (Patient not taking: Reported on 06/12/2022)   ondansetron (ZOFRAN-ODT) 4 MG disintegrating tablet Take 4 mg by mouth every 8 (eight) hours as needed.   ondansetron (ZOFRAN-ODT) 4 MG disintegrating tablet Take by mouth.   pantoprazole (PROTONIX) 40 MG tablet Take 1 tablet by mouth daily.   predniSONE (DELTASONE) 20 MG tablet Day 1-7: Take 2 tablets daily. Day 8-14: Take 1 tablet daily. Take medications with breakfast.   senna-docusate (SENOKOT-S) 8.6-50 MG tablet Take 1 tablet by mouth 2 (two) times daily. (Patient not taking: Reported on 06/12/2022)   triamcinolone cream (KENALOG) 0.1 % Apply topically. (Patient not taking: Reported on 06/12/2022)   valACYclovir (VALTREX) 1000 MG tablet Take 4,000 mg by mouth once.   No facility-administered encounter medications on file as of 10/15/2022.    Allergies as of 10/15/2022 - Review Complete 10/12/2022  Allergen Reaction Noted   Fluogen [influenza virus vaccine] Shortness Of Breath and Rash 01/06/2021   Macrobid [nitrofurantoin] Shortness Of Breath 01/06/2021   Pyridium [phenazopyridine] Shortness Of Breath 01/06/2021   Latex Dermatitis 10/07/2013   Naltrexone Hives 01/06/2021   Pylera [bis subcit-metronid-tetracyc] Itching 01/06/2021    Past Medical History:  Diagnosis Date   Anemia    Anxiety    Depression    Fibromyalgia    GERD (gastroesophageal reflux disease)  Hereditary hemochromatosis (HCC)    Insomnia    Rosacea    Systemic lupus erythematosus (HCC)    Vitamin D deficiency     Past Surgical History:  Procedure Laterality Date   CHOLECYSTECTOMY  2008   DILATION AND CURETTAGE OF UTERUS  1994   LAPAROSCOPIC GASTRIC SLEEVE RESECTION  2017    Family History  Problem Relation Age of Onset   Hypertension Mother    Heart murmur Mother    Celiac disease Mother     Hypertension Father    Heart attack Father    Transient ischemic attack Father    Transient ischemic attack Maternal Grandmother    CVA Paternal Grandmother    Hypertension Paternal Grandmother    Heart attack Paternal Grandfather    Hypertension Paternal Grandfather    Thyroid disease Sister    Hypertension Brother     Social History   Socioeconomic History   Marital status: Single    Spouse name: Not on file   Number of children: 4   Years of education: Not on file   Highest education level: Not on file  Occupational History   Not on file  Tobacco Use   Smoking status: Former    Types: Cigarettes    Quit date: 2000    Years since quitting: 23.8   Smokeless tobacco: Not on file  Vaping Use   Vaping Use: Never used  Substance and Sexual Activity   Alcohol use: Yes    Comment: socially   Drug use: Never   Sexual activity: Yes    Birth control/protection: I.U.D.  Other Topics Concern   Not on file  Social History Narrative   Not on file   Social Determinants of Health   Financial Resource Strain: Not on file  Food Insecurity: Not on file  Transportation Needs: Not on file  Physical Activity: Not on file  Stress: Not on file  Social Connections: Not on file  Intimate Partner Violence: Not on file    Review of Systems  Respiratory:  Positive for cough and shortness of breath.     There were no vitals filed for this visit.   Physical Exam Constitutional:      Appearance: She is obese.  HENT:     Head: Normocephalic.     Mouth/Throat:     Mouth: Mucous membranes are moist.  Eyes:     Conjunctiva/sclera: Conjunctivae normal.  Cardiovascular:     Rate and Rhythm: Normal rate and regular rhythm.     Heart sounds: No murmur heard.    No friction rub.  Pulmonary:     Effort: No respiratory distress.     Breath sounds: No stridor. No wheezing or rhonchi.  Musculoskeletal:     Cervical back: No rigidity or tenderness.  Neurological:     Mental Status:  She is alert.  Psychiatric:        Mood and Affect: Mood normal.      Data Reviewed: Recent CT scan reviewed showing no acute infiltrate, no PE-CT was reviewed myself  Assessment:  Cough  Postviral cough, shortness of breath  No underlying lung disease  Plan/Recommendations: Prescription for prednisone  N-acetylcysteine, Mucinex for sputum clearance  Follow-up in 6 to 8 weeks  May consider PFT, echocardiogram if worsening symptoms  Encouraged to call with significant concerns   Virl Diamond MD [facility] 10/15/2022, 1:04 PM  CC: Philemon Kingdom, MD

## 2022-11-16 ENCOUNTER — Inpatient Hospital Stay: Payer: BC Managed Care – PPO | Attending: Oncology

## 2022-11-16 ENCOUNTER — Encounter: Payer: Self-pay | Admitting: Oncology

## 2022-11-16 ENCOUNTER — Other Ambulatory Visit: Payer: Self-pay | Admitting: Oncology

## 2022-11-16 LAB — CMP (CANCER CENTER ONLY)
ALT: 20 U/L (ref 0–44)
AST: 21 U/L (ref 15–41)
Albumin: 3.5 g/dL (ref 3.5–5.0)
Alkaline Phosphatase: 33 U/L — ABNORMAL LOW (ref 38–126)
Anion gap: 7 (ref 5–15)
BUN: 17 mg/dL (ref 6–20)
CO2: 25 mmol/L (ref 22–32)
Calcium: 8.5 mg/dL — ABNORMAL LOW (ref 8.9–10.3)
Chloride: 108 mmol/L (ref 98–111)
Creatinine: 0.8 mg/dL (ref 0.44–1.00)
GFR, Estimated: >60 mL/min (ref >60)
Glucose, Bld: 97 mg/dL (ref 70–99)
Potassium: 3.8 mmol/L (ref 3.5–5.1)
Sodium: 140 mmol/L (ref 135–145)
Total Bilirubin: 0.5 mg/dL (ref 0.3–1.2)
Total Protein: 6.7 g/dL (ref 6.5–8.1)

## 2022-11-16 LAB — CBC WITH DIFFERENTIAL (CANCER CENTER ONLY)
Abs Immature Granulocytes: 0 10*3/uL (ref 0.00–0.07)
Basophils Absolute: 0.1 10*3/uL (ref 0.0–0.1)
Basophils Relative: 1 %
Eosinophils Absolute: 0.2 10*3/uL (ref 0.0–0.5)
Eosinophils Relative: 3 %
HCT: 38.1 % (ref 36.0–46.0)
Hemoglobin: 12.6 g/dL (ref 12.0–15.0)
Immature Granulocytes: 0 %
Lymphocytes Relative: 35 %
Lymphs Abs: 2.6 10*3/uL (ref 0.7–4.0)
MCH: 31.6 pg (ref 26.0–34.0)
MCHC: 33.1 g/dL (ref 30.0–36.0)
MCV: 95.5 fL (ref 80.0–100.0)
Monocytes Absolute: 0.9 10*3/uL (ref 0.1–1.0)
Monocytes Relative: 13 %
Neutro Abs: 3.5 10*3/uL (ref 1.7–7.7)
Neutrophils Relative %: 48 %
Platelet Count: 308 10*3/uL (ref 150–400)
RBC: 3.99 MIL/uL (ref 3.87–5.11)
RDW: 13 % (ref 11.5–15.5)
WBC Count: 7.3 10*3/uL (ref 4.0–10.5)
nRBC: 0 % (ref 0.0–0.2)

## 2022-11-16 LAB — IRON AND TIBC
Iron: 117 ug/dL (ref 28–170)
Saturation Ratios: 41 % — ABNORMAL HIGH (ref 10.4–31.8)
TIBC: 286 ug/dL (ref 250–450)
UIBC: 169 ug/dL

## 2022-11-16 LAB — FERRITIN: Ferritin: 22 ng/mL (ref 11–307)

## 2022-11-18 NOTE — Progress Notes (Signed)
[facility]  [address] [place],  [place]  [address] [phone]  Clinic Day:  11/19/2022  Referring physician: Philemon Kingdom, MD  HISTORY OF PRESENT ILLNESS:  The patient is a 46 y.o. 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   She comes in today to reassess her iron parameters.  Since her last visit, which was in September 2022, she has been doing well.  She denies having any systemic symptoms which concern her for complications related to her underlying hemochromatosis.  PHYSICAL EXAM:  Blood pressure 127/69, pulse 62, temperature 97.9 F (36.6 C), temperature source Oral, resp. rate 18, height 5\' 4"  (1.626 m), weight 188 lb 6.4 oz (85.5 kg), SpO2 98 %. Wt Readings from Last 3 Encounters:  11/19/22 188 lb 6.4 oz (85.5 kg)  10/15/22 185 lb (83.9 kg)  10/12/22 181 lb (82.1 kg)   Body mass index is 32.34 kg/m.   Performance status (ECOG): 0 Physical Exam Constitutional:      Appearance: Normal appearance. She is not ill-appearing.  HENT:     Mouth/Throat:     Mouth: Mucous membranes are moist.     Pharynx: Oropharynx is clear. No oropharyngeal exudate or posterior oropharyngeal erythema.  Cardiovascular:     Rate and Rhythm: Normal rate and regular rhythm.     Heart sounds: No murmur heard.    No friction rub. No gallop.  Pulmonary:     Effort: Pulmonary effort is normal. No respiratory distress.     Breath sounds: Normal breath sounds. No wheezing, rhonchi or rales.  Abdominal:     General: Bowel sounds are normal. There is no distension.     Palpations: Abdomen is soft. There is no mass.     Tenderness: There is no abdominal tenderness.  Musculoskeletal:        General: No swelling.     Right lower leg: No edema.     Left lower leg: No edema.  Lymphadenopathy:     Cervical: No cervical adenopathy.     Upper Body:     Right upper body: No supraclavicular or axillary adenopathy.     Left upper body: No supraclavicular or  axillary adenopathy.     Lower Body: No right inguinal adenopathy. No left inguinal adenopathy.  Skin:    General: Skin is warm.     Coloration: Skin is not jaundiced.     Findings: No lesion or rash.  Neurological:     General: No focal deficit present.     Mental Status: She is alert and oriented to person, place, and time. Mental status is at baseline.  Psychiatric:        Mood and Affect: Mood normal.        Behavior: Behavior normal.        Thought Content: Thought content normal.    LABS:      Latest Ref Rng & Units 11/16/2022    1:52 PM 10/12/2022    3:21 PM 08/25/2021   12:00 AM  CBC  WBC 4.0 - 10.5 K/uL 7.3  11.6  4.8      Hemoglobin 12.0 - 15.0 g/dL 10/25/2021  21.3  08.6      Hematocrit 36.0 - 46.0 % 38.1  43.0  42      Platelets 150 - 400 K/uL 308  331  244         This result is from an external source.      Latest Ref Rng & Units 11/16/2022  1:52 PM 10/12/2022    3:21 PM 05/08/2021   12:00 AM  CMP  Glucose 70 - 99 mg/dL 97  96    BUN 6 - 20 mg/dL 17  16  16    Creatinine 0.44 - 1.00 mg/dL  2.87  0.8   Sodium 135 - 145 mmol/L 140  136  136   Potassium 3.5 - 5.1 mmol/L 3.8  3.3  3.7   Chloride 98 - 111 mmol/L 108  106  106   CO2 22 - 32 mmol/L 25  23  26    Calcium 8.9 - 10.3 mg/dL 8.5  8.7  8.6   Total Protein 6.5 - 8.1 g/dL 6.7     Total Bilirubin 0.3 - 1.2 mg/dL 0.5     Alkaline Phos 38 - 126 U/L 33   39   AST 15 - 41 U/L 21   30   ALT 0 - 44 U/L 20   34     Latest Reference Range & Units 11/16/22 13:47  Iron 28 - 170 ug/dL  UIBC ug/dL 14/01/23  TIBC 672 - 094 ug/dL 709  Saturation Ratios 10.4 - 31.8 % 41 (H)  Ferritin 11 - 307 ng/mL 22  (H): Data is abnormally high  ASSESSMENT & PLAN:  A 46 year old woman with hemochromatosis (C282Y/H63D).  Based upon her labs today, her ferritin is below 50.  Furthermore, none of her iron parameters is particularly high.  Based upon this, phlebotomies will be held.  I will see this patient back in 6 months for  repeat clinical assessment.  The patient understands all the plans discussed today and is in agreement with them.  Analysa Nutting 366, MD

## 2022-11-19 ENCOUNTER — Inpatient Hospital Stay: Payer: BC Managed Care – PPO | Admitting: Oncology

## 2022-11-19 ENCOUNTER — Other Ambulatory Visit: Payer: Self-pay | Admitting: Oncology

## 2022-11-19 ENCOUNTER — Encounter: Payer: Self-pay | Admitting: Oncology

## 2022-11-19 ENCOUNTER — Ambulatory Visit: Payer: 59 | Admitting: Pulmonary Disease

## 2023-01-14 DIAGNOSIS — L720 Epidermal cyst: Secondary | ICD-10-CM | POA: Diagnosis not present

## 2023-01-14 DIAGNOSIS — D2239 Melanocytic nevi of other parts of face: Secondary | ICD-10-CM | POA: Diagnosis not present

## 2023-01-21 DIAGNOSIS — F321 Major depressive disorder, single episode, moderate: Secondary | ICD-10-CM | POA: Diagnosis not present

## 2023-01-21 DIAGNOSIS — M79641 Pain in right hand: Secondary | ICD-10-CM | POA: Diagnosis not present

## 2023-01-21 DIAGNOSIS — E559 Vitamin D deficiency, unspecified: Secondary | ICD-10-CM | POA: Diagnosis not present

## 2023-01-21 DIAGNOSIS — Z79899 Other long term (current) drug therapy: Secondary | ICD-10-CM | POA: Diagnosis not present

## 2023-01-21 DIAGNOSIS — M79642 Pain in left hand: Secondary | ICD-10-CM | POA: Diagnosis not present

## 2023-01-21 DIAGNOSIS — F419 Anxiety disorder, unspecified: Secondary | ICD-10-CM | POA: Diagnosis not present

## 2023-01-21 DIAGNOSIS — R5383 Other fatigue: Secondary | ICD-10-CM | POA: Diagnosis not present

## 2023-01-21 DIAGNOSIS — M329 Systemic lupus erythematosus, unspecified: Secondary | ICD-10-CM | POA: Diagnosis not present

## 2023-01-21 DIAGNOSIS — K219 Gastro-esophageal reflux disease without esophagitis: Secondary | ICD-10-CM | POA: Diagnosis not present

## 2023-05-03 ENCOUNTER — Encounter: Payer: Self-pay | Admitting: *Deleted

## 2023-05-03 ENCOUNTER — Encounter: Payer: Self-pay | Admitting: Cardiology

## 2023-05-20 ENCOUNTER — Other Ambulatory Visit: Payer: BC Managed Care – PPO

## 2023-05-21 ENCOUNTER — Ambulatory Visit: Payer: BC Managed Care – PPO | Admitting: Oncology

## 2023-05-28 ENCOUNTER — Inpatient Hospital Stay: Payer: BC Managed Care – PPO | Admitting: Oncology

## 2023-07-01 DIAGNOSIS — R079 Chest pain, unspecified: Secondary | ICD-10-CM | POA: Insufficient documentation

## 2023-07-26 ENCOUNTER — Inpatient Hospital Stay: Payer: BC Managed Care – PPO | Attending: Oncology

## 2023-07-26 ENCOUNTER — Ambulatory Visit: Payer: BC Managed Care – PPO | Attending: Cardiology | Admitting: Cardiology

## 2023-07-26 ENCOUNTER — Encounter: Payer: Self-pay | Admitting: Cardiology

## 2023-07-26 ENCOUNTER — Other Ambulatory Visit: Payer: BC Managed Care – PPO

## 2023-07-26 VITALS — BP 118/70 | HR 77 | Ht 64.0 in | Wt 193.0 lb

## 2023-07-26 DIAGNOSIS — R079 Chest pain, unspecified: Secondary | ICD-10-CM

## 2023-07-26 DIAGNOSIS — I340 Nonrheumatic mitral (valve) insufficiency: Secondary | ICD-10-CM | POA: Insufficient documentation

## 2023-07-26 DIAGNOSIS — M329 Systemic lupus erythematosus, unspecified: Secondary | ICD-10-CM

## 2023-07-26 DIAGNOSIS — R002 Palpitations: Secondary | ICD-10-CM

## 2023-07-26 LAB — CBC WITH DIFFERENTIAL (CANCER CENTER ONLY)
Abs Immature Granulocytes: 0.02 10*3/uL (ref 0.00–0.07)
Basophils Absolute: 0 10*3/uL (ref 0.0–0.1)
Basophils Relative: 0 %
Eosinophils Absolute: 0.2 10*3/uL (ref 0.0–0.5)
Eosinophils Relative: 3 %
HCT: 36.3 % (ref 36.0–46.0)
Hemoglobin: 11.8 g/dL — ABNORMAL LOW (ref 12.0–15.0)
Immature Granulocytes: 0 %
Lymphocytes Relative: 31 %
Lymphs Abs: 2.4 10*3/uL (ref 0.7–4.0)
MCH: 30.6 pg (ref 26.0–34.0)
MCHC: 32.5 g/dL (ref 30.0–36.0)
MCV: 94 fL (ref 80.0–100.0)
Monocytes Absolute: 0.9 10*3/uL (ref 0.1–1.0)
Monocytes Relative: 11 %
Neutro Abs: 4.4 10*3/uL (ref 1.7–7.7)
Neutrophils Relative %: 55 %
Platelet Count: 324 10*3/uL (ref 150–400)
RBC: 3.86 MIL/uL — ABNORMAL LOW (ref 3.87–5.11)
RDW: 13.4 % (ref 11.5–15.5)
WBC Count: 8 10*3/uL (ref 4.0–10.5)
nRBC: 0 % (ref 0.0–0.2)

## 2023-07-26 LAB — CMP (CANCER CENTER ONLY)
ALT: 12 U/L (ref 0–44)
AST: 14 U/L — ABNORMAL LOW (ref 15–41)
Albumin: 3.7 g/dL (ref 3.5–5.0)
Alkaline Phosphatase: 36 U/L — ABNORMAL LOW (ref 38–126)
Anion gap: 9 (ref 5–15)
BUN: 20 mg/dL (ref 6–20)
CO2: 23 mmol/L (ref 22–32)
Calcium: 8.6 mg/dL — ABNORMAL LOW (ref 8.9–10.3)
Chloride: 104 mmol/L (ref 98–111)
Creatinine: 0.84 mg/dL (ref 0.44–1.00)
GFR, Estimated: >60 mL/min (ref >60)
Glucose, Bld: 99 mg/dL (ref 70–99)
Potassium: 3.8 mmol/L (ref 3.5–5.1)
Sodium: 136 mmol/L (ref 135–145)
Total Bilirubin: 0.3 mg/dL (ref 0.3–1.2)
Total Protein: 7 g/dL (ref 6.5–8.1)

## 2023-07-26 LAB — IRON AND TIBC
Iron: 56 ug/dL (ref 28–170)
Saturation Ratios: 19 % (ref 10.4–31.8)
TIBC: 298 ug/dL (ref 250–450)
UIBC: 242 ug/dL

## 2023-07-26 LAB — FERRITIN: Ferritin: 12 ng/mL (ref 11–307)

## 2023-07-26 NOTE — Progress Notes (Signed)
Cardiology Consultation:    Date:  07/26/2023   ID:  Casimiro Needle, DOB [birth year], MRN [id number]  PCP:  Philemon Kingdom, MD  Cardiologist:  Gypsy Balsam, MD   Referring MD: Philemon Kingdom, MD   Chief Complaint  Patient presents with   Establish Care    H/o MVR    History of Present Illness:    Wendy Spencer is a 47 y.o. female who is being seen today for the evaluation of mitral valve regurgitation at the request of Philemon Kingdom, MD. she is a daughter of my patient she would like to be established as a patient in my practice she is not extrication nation and works as a Geophysicist/field seismologist.  In the spring time she ended up being an assignment started having chest pain and was admitted to the hospital stress test was done echocardiogram was done everything was fine and she was discharged home then she presented 1 more time to the hospital this time happened last month she had cardiac enzymes drawn which were normal, CT chest was done for PE which was negative, she also have coronary CT angio done which showed normal coronaries with calcium score 0.  Additional problem include systemic lupus erythematosus, hemochromatosis.  She comes today to my office 1 to be established as a patient.  Overall she is doing fine, other problems visit is the fact that since springtime she started experiencing shortness of breath she used to exercise on the regular basis however stopped doing this after her daughter 3 years old died because of COVID she went to depression stop exercising now she moved to placement there is to floor building and she had to climb the stairs and noticed significant shortness of breath is difficult to tell if this is related to some organic problem or simply deconditioning but so far likely cardiac workup is negative.   Past Medical History:  Diagnosis Date   Achilles tendinitis of left lower extremity 04/22/2017   Allergic dermatitis 08/05/2021    Last Assessment & Plan: Formatting of this note might be different from the original. Symptoms consistent with allergic dermatitis Take steriods as prescribed Stay out of the sun/heat  worsening of current symptoms, present to the ER for further evaluation and treatment. Patient verbalized understanding and agreed with this plan of care.   Anemia    Anxiety    Depression    Fibromyalgia    GERD (gastroesophageal reflux disease)    Hereditary hemochromatosis (HCC)    Insomnia    Lupus (HCC)    Obstructive sleep apnea on CPAP 07/18/2016   Rosacea    Systemic lupus erythematosus (HCC)    Vitamin D deficiency     Past Surgical History:  Procedure Laterality Date   CHOLECYSTECTOMY  2008   DILATION AND CURETTAGE OF UTERUS  1994   LAPAROSCOPIC GASTRIC SLEEVE RESECTION  2017    Current Medications: Current Meds  Medication Sig   ALPRAZolam (XANAX) 0.25 MG tablet Take 0.25 mg by mouth 3 (three) times daily as needed for anxiety.   FLUoxetine (PROZAC) 10 MG capsule Take 10 mg by mouth daily.   hydroxychloroquine (PLAQUENIL) 200 MG tablet Take 400 mg by mouth daily.   ondansetron (ZOFRAN-ODT) 4 MG disintegrating tablet Take 4 mg by mouth every 8 (eight) hours as needed for nausea or vomiting.   pantoprazole (PROTONIX) 40 MG tablet Take 1 tablet by mouth daily.   valACYclovir (VALTREX) 1000 MG tablet Take 2,000 mg by mouth every 12 (  twelve) hours as needed (Fever blisters).   Vitamin D, Ergocalciferol, (DRISDOL) 1.25 MG (50000 UNIT) CAPS capsule Take 50,000 Units by mouth once a week.   [DISCONTINUED] EPINEPHrine 0.3 mg/0.3 mL IJ SOAJ injection Inject 0.3 mg into the muscle as needed for anaphylaxis.     Allergies:   Fluogen [influenza virus vaccine], Haemophilus b polysaccharide vaccine, Macrobid [nitrofurantoin], Phenazopyridine hcl, Pyridium [phenazopyridine], Latex, Naltrexone, Avocado, Banana, Duloxetine hcl, Grape (artificial) flavor, Kiwi extract, and Pylera [bis  subcit-metronid-tetracyc]   Social History   Socioeconomic History   Marital status: Single    Spouse name: Not on file   Number of children: 4   Years of education: Not on file   Highest education level: Not on file  Occupational History   Not on file  Tobacco Use   Smoking status: Former    Current packs/day: 0.00    Types: Cigarettes    Quit date: 2000    Years since quitting: 24.6   Smokeless tobacco: Not on file  Vaping Use   Vaping status: Never Used  Substance and Sexual Activity   Alcohol use: Yes    Comment: socially   Drug use: Never   Sexual activity: Yes    Birth control/protection: I.U.D.  Other Topics Concern   Not on file  Social History Narrative   Not on file   Social Determinants of Health   Financial Resource Strain: Not on file  Food Insecurity: Not on file  Transportation Needs: Not on file  Physical Activity: Not on file  Stress: Not on file  Social Connections: Not on file     Family History: The patient's family history includes CAD in her mother; CVA in her paternal grandmother; Celiac disease in her mother; Heart attack in her father and paternal grandfather; Heart disease in her maternal grandfather; Heart murmur in her father and mother; Hypertension in her brother, father, mother, paternal grandfather, and paternal grandmother; Skin cancer in her mother; Thyroid disease in her father and sister; Transient ischemic attack in her father and maternal grandmother. ROS:   Please see the history of present illness.    All 14 point review of systems negative except as described per history of present illness.  EKGs/Labs/Other Studies Reviewed:    The following studies were reviewed today: I did review echocardiogram done in March and hospital in [place], ejection fraction is normal mild MR mild TR, diastolic function was fine.  I did review coronary CT angio done just few weeks ago in [place] show calcium score 0 no coronary artery  disease  EKG:  EKG Interpretation Date/Time:  Friday July 26 2023 15:48:37 EDT Ventricular Rate:  77 PR Interval:  196 QRS Duration:  90 QT Interval:  386 QTC Calculation: 436 R Axis:   3  Text Interpretation: Normal sinus rhythm Cannot rule out Anterior infarct , age undetermined Abnormal ECG When compared with ECG of 12-Oct-2022 10:30, No significant change was found Confirmed by Gypsy Balsam [phone]) on 07/26/2023 3:54:23 PM    Recent Labs: 11/16/2022: ALT 20; BUN 17; Creatinine 0.80; Hemoglobin 12.6; Platelet Count 308; Potassium 3.8; Sodium 140  Recent Lipid Panel No results found for: "CHOL", "TRIG", "HDL", "CHOLHDL", "VLDL", "LDLCALC", "LDLDIRECT"  Physical Exam:    VS:  BP 118/70 (BP Location: Left Arm, Patient Position: Sitting)   Pulse 77   Ht 5\' 4"  (1.626 m)   Wt 193 lb (87.5 kg)   SpO2 97%   BMI 33.13 kg/m     Wt Readings from  Last 3 Encounters:  07/26/23 193 lb (87.5 kg)  01/21/23 194 lb (88 kg)  11/19/22 188 lb 6.4 oz (85.5 kg)     GEN:  Well nourished, well developed in no acute distress HEENT: Normal NECK: No JVD; No carotid bruits LYMPHATICS: No lymphadenopathy CARDIAC: RRR, no murmurs, no rubs, no gallops RESPIRATORY:  Clear to auscultation without rales, wheezing or rhonchi  ABDOMEN: Soft, non-tender, non-distended MUSCULOSKELETAL:  No edema; No deformity  SKIN: Warm and dry NEUROLOGIC:  Alert and oriented x 3 PSYCHIATRIC:  Normal affect   ASSESSMENT:    1. Palpitations   2. Nonrheumatic mitral valve regurgitation   3. Chest pain, unspecified type   4. Hereditary hemochromatosis (HCC)   5. Systemic lupus erythematosus, unspecified SLE type, unspecified organ involvement status (HCC)    PLAN:    In order of problems listed above:  I did not he will have any serious cardiac issue.  Holter so far has been negative systolic diastolic function normal, coronary CT angio showing no evidence of any coronary artery disease the key will be of  course risk factors modifications in terms of her dyspnea on exertion.  Again cardiac workup negative I do not think is cardiac explanation for this but I told her if this continue she may need referral to pulmonologist however I suspect that part of her symptomatology is simply deconditioning the fact that she stopped exercising on the regular basis when she was in depression after death of her daughter.  I recommended gradually slowly started exercising on the regular basis she understand this and she is willing to do that. Chest pain atypical coronary CT angio normal. Systemic lupus erythematosus: Follow-up by rheumatologist.  She is taking Plaquenil but she is not sure if it helps her. Hemochromatosis continue monitoring. Dyslipidemia I did review K PN which show me her LDL 79 HDL 80 good cholesterol profile we will continue present management.  I will see her back in a month C5 6 months to see how she does I told her if shortness of breath continue being a problem that we need to schedule her to have pulmonary function testing pulmonologist to see her.   Medication Adjustments/Labs and Tests Ordered: Current medicines are reviewed at length with the patient today.  Concerns regarding medicines are outlined above.  Orders Placed This Encounter  Procedures   EKG 12-Lead   No orders of the defined types were placed in this encounter.   Signed, Georgeanna Lea, MD, [facility]. 07/26/2023 4:44 PM    [facility]

## 2023-07-26 NOTE — Patient Instructions (Signed)

## 2023-07-28 NOTE — Progress Notes (Unsigned)
[facility]  [address] [place],  [place]  [address] [phone]  Clinic Day:  07/29/2023  Referring physician: Philemon Kingdom, MD  HISTORY OF PRESENT ILLNESS:  The patient is a 48 y.o. 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   She comes in today to reassess her iron parameters.  Since her last visit, which was in September 2022, she has been doing well.  She denies having any systemic symptoms which concern her for complications related to her underlying hemochromatosis.  PHYSICAL EXAM:  Blood pressure 118/71, pulse 90, temperature 98.9 F (37.2 C), resp. rate 16, height 5\' 4"  (1.626 m), weight 188 lb 14.4 oz (85.7 kg), SpO2 98%. Wt Readings from Last 3 Encounters:  07/29/23 188 lb 14.4 oz (85.7 kg)  07/26/23 193 lb (87.5 kg)  01/21/23 194 lb (88 kg)   Body mass index is 32.42 kg/m.   Performance status (ECOG): 0 Physical Exam Constitutional:      Appearance: Normal appearance. She is not ill-appearing.  HENT:     Mouth/Throat:     Mouth: Mucous membranes are moist.     Pharynx: Oropharynx is clear. No oropharyngeal exudate or posterior oropharyngeal erythema.  Cardiovascular:     Rate and Rhythm: Normal rate and regular rhythm.     Heart sounds: No murmur heard.    No friction rub. No gallop.  Pulmonary:     Effort: Pulmonary effort is normal. No respiratory distress.     Breath sounds: Normal breath sounds. No wheezing, rhonchi or rales.  Abdominal:     General: Bowel sounds are normal. There is no distension.     Palpations: Abdomen is soft. There is no mass.     Tenderness: There is no abdominal tenderness.  Musculoskeletal:        General: No swelling.     Right lower leg: No edema.     Left lower leg: No edema.  Lymphadenopathy:     Cervical: No cervical adenopathy.     Upper Body:     Right upper body: No supraclavicular or axillary adenopathy.     Left upper body: No supraclavicular or axillary adenopathy.      Lower Body: No right inguinal adenopathy. No left inguinal adenopathy.  Skin:    General: Skin is warm.     Coloration: Skin is not jaundiced.     Findings: No lesion or rash.  Neurological:     General: No focal deficit present.     Mental Status: She is alert and oriented to person, place, and time. Mental status is at baseline.  Psychiatric:        Mood and Affect: Mood normal.        Behavior: Behavior normal.        Thought Content: Thought content normal.    LABS:      Latest Ref Rng & Units 07/26/2023    2:24 PM 11/16/2022    1:52 PM 10/12/2022    3:21 PM  CBC  WBC 4.0 - 10.5 K/uL 8.0  7.3  11.6   Hemoglobin 12.0 - 15.0 g/dL 91.4  78.2  95.6   Hematocrit 36.0 - 46.0 % 36.3  38.1  43.0   Platelets 150 - 400 K/uL 324  308  331       Latest Ref Rng & Units 07/26/2023    2:24 PM 11/16/2022    1:52 PM 10/12/2022    3:21 PM  CMP  Glucose 70 - 99 mg/dL 99  97  96   BUN 6 - 20 mg/dL 20  17  16    Creatinine 0.44 - 1.00 mg/dL 2.95  6.21  3.08   Sodium 135 - 145 mmol/L 136  140  136   Potassium 3.5 - 5.1 mmol/L 3.8  3.8  3.3   Chloride 98 - 111 mmol/L 104  108  106   CO2 22 - 32 mmol/L 23  25  23    Calcium 8.9 - 10.3 mg/dL 8.6  8.5  8.7   Total Protein 6.5 - 8.1 g/dL 7.0  6.7    Total Bilirubin 0.3 - 1.2 mg/dL 0.3  0.5    Alkaline Phos 38 - 126 U/L 36  33    AST 15 - 41 U/L 14  21    ALT 0 - 44 U/L 12  20      Latest Reference Range & Units 07/26/23 14:24  Iron 28 - 170 ug/dL 56  UIBC ug/dL 657  TIBC 846 - 962 ug/dL 952  Saturation Ratios 10.4 - 31.8 % 19  Ferritin 11 - 307 ng/mL 12   ASSESSMENT & PLAN:  A 47 year old woman with hemochromatosis (C282Y/H63D).  I am pleased as her ferritin remains well below 50-100.  Furthermore, none of her iron parameters is particularly high.  Based upon this, phlebotomies will continue to be held.  I will see this patient back in 1 year for repeat clinical assessment.  The patient understands all the plans discussed today and is in  agreement with them.  Virat Prather Kirby Funk, MD

## 2023-07-29 ENCOUNTER — Inpatient Hospital Stay: Payer: BC Managed Care – PPO | Admitting: Oncology

## 2023-07-29 ENCOUNTER — Inpatient Hospital Stay: Payer: BC Managed Care – PPO

## 2023-07-29 ENCOUNTER — Telehealth: Payer: Self-pay | Admitting: Oncology

## 2023-07-29 ENCOUNTER — Other Ambulatory Visit: Payer: Self-pay | Admitting: Oncology

## 2023-07-29 DIAGNOSIS — F321 Major depressive disorder, single episode, moderate: Secondary | ICD-10-CM | POA: Diagnosis not present

## 2023-07-29 DIAGNOSIS — Z79899 Other long term (current) drug therapy: Secondary | ICD-10-CM | POA: Diagnosis not present

## 2023-07-29 DIAGNOSIS — K219 Gastro-esophageal reflux disease without esophagitis: Secondary | ICD-10-CM | POA: Diagnosis not present

## 2023-07-29 DIAGNOSIS — M329 Systemic lupus erythematosus, unspecified: Secondary | ICD-10-CM | POA: Diagnosis not present

## 2023-07-29 NOTE — Telephone Encounter (Signed)
07/29/2023  Patient did not show for Phlebotomy Therapy Appt - Per Tammy, she may not have needed it

## 2023-08-16 DIAGNOSIS — F4321 Adjustment disorder with depressed mood: Secondary | ICD-10-CM | POA: Diagnosis not present

## 2023-08-23 DIAGNOSIS — F4321 Adjustment disorder with depressed mood: Secondary | ICD-10-CM | POA: Diagnosis not present

## 2023-08-26 DIAGNOSIS — M25461 Effusion, right knee: Secondary | ICD-10-CM | POA: Diagnosis not present

## 2023-08-26 DIAGNOSIS — H6993 Unspecified Eustachian tube disorder, bilateral: Secondary | ICD-10-CM | POA: Diagnosis not present

## 2023-08-26 DIAGNOSIS — H6690 Otitis media, unspecified, unspecified ear: Secondary | ICD-10-CM | POA: Diagnosis not present

## 2023-08-26 DIAGNOSIS — M7732 Calcaneal spur, left foot: Secondary | ICD-10-CM | POA: Diagnosis not present

## 2023-08-26 DIAGNOSIS — M25561 Pain in right knee: Secondary | ICD-10-CM | POA: Diagnosis not present

## 2023-08-26 DIAGNOSIS — M79672 Pain in left foot: Secondary | ICD-10-CM | POA: Diagnosis not present

## 2023-09-13 DIAGNOSIS — F4321 Adjustment disorder with depressed mood: Secondary | ICD-10-CM | POA: Diagnosis not present

## 2023-09-20 DIAGNOSIS — F4321 Adjustment disorder with depressed mood: Secondary | ICD-10-CM | POA: Diagnosis not present

## 2023-09-27 DIAGNOSIS — F4321 Adjustment disorder with depressed mood: Secondary | ICD-10-CM | POA: Diagnosis not present

## 2023-10-04 DIAGNOSIS — F4321 Adjustment disorder with depressed mood: Secondary | ICD-10-CM | POA: Diagnosis not present

## 2023-10-11 DIAGNOSIS — F4321 Adjustment disorder with depressed mood: Secondary | ICD-10-CM | POA: Diagnosis not present

## 2023-10-18 DIAGNOSIS — F4321 Adjustment disorder with depressed mood: Secondary | ICD-10-CM | POA: Diagnosis not present

## 2023-11-20 DIAGNOSIS — M25572 Pain in left ankle and joints of left foot: Secondary | ICD-10-CM | POA: Diagnosis not present

## 2023-11-20 DIAGNOSIS — M79672 Pain in left foot: Secondary | ICD-10-CM | POA: Diagnosis not present

## 2023-11-20 DIAGNOSIS — M19079 Primary osteoarthritis, unspecified ankle and foot: Secondary | ICD-10-CM | POA: Diagnosis not present

## 2023-11-20 DIAGNOSIS — G8929 Other chronic pain: Secondary | ICD-10-CM | POA: Diagnosis not present

## 2023-11-22 DIAGNOSIS — F4321 Adjustment disorder with depressed mood: Secondary | ICD-10-CM | POA: Diagnosis not present

## 2023-11-27 DIAGNOSIS — F4321 Adjustment disorder with depressed mood: Secondary | ICD-10-CM | POA: Diagnosis not present

## 2023-12-02 DIAGNOSIS — Z1231 Encounter for screening mammogram for malignant neoplasm of breast: Secondary | ICD-10-CM | POA: Diagnosis not present

## 2023-12-04 DIAGNOSIS — F4321 Adjustment disorder with depressed mood: Secondary | ICD-10-CM | POA: Diagnosis not present

## 2023-12-25 DIAGNOSIS — F4321 Adjustment disorder with depressed mood: Secondary | ICD-10-CM | POA: Diagnosis not present

## 2024-01-03 DIAGNOSIS — M329 Systemic lupus erythematosus, unspecified: Secondary | ICD-10-CM | POA: Diagnosis not present

## 2024-01-03 DIAGNOSIS — Z79899 Other long term (current) drug therapy: Secondary | ICD-10-CM | POA: Diagnosis not present

## 2024-01-08 DIAGNOSIS — F4321 Adjustment disorder with depressed mood: Secondary | ICD-10-CM | POA: Diagnosis not present

## 2024-01-22 DIAGNOSIS — F4321 Adjustment disorder with depressed mood: Secondary | ICD-10-CM | POA: Diagnosis not present

## 2024-01-25 DIAGNOSIS — R112 Nausea with vomiting, unspecified: Secondary | ICD-10-CM | POA: Diagnosis not present

## 2024-01-25 DIAGNOSIS — R1110 Vomiting, unspecified: Secondary | ICD-10-CM | POA: Diagnosis not present

## 2024-01-25 DIAGNOSIS — R051 Acute cough: Secondary | ICD-10-CM | POA: Diagnosis not present

## 2024-02-05 DIAGNOSIS — F4321 Adjustment disorder with depressed mood: Secondary | ICD-10-CM | POA: Diagnosis not present

## 2024-02-18 DIAGNOSIS — F4321 Adjustment disorder with depressed mood: Secondary | ICD-10-CM | POA: Diagnosis not present

## 2024-03-03 DIAGNOSIS — F4321 Adjustment disorder with depressed mood: Secondary | ICD-10-CM | POA: Diagnosis not present

## 2024-03-14 ENCOUNTER — Emergency Department (HOSPITAL_BASED_OUTPATIENT_CLINIC_OR_DEPARTMENT_OTHER)

## 2024-03-14 ENCOUNTER — Encounter (HOSPITAL_BASED_OUTPATIENT_CLINIC_OR_DEPARTMENT_OTHER): Payer: Self-pay | Admitting: Emergency Medicine

## 2024-03-14 ENCOUNTER — Other Ambulatory Visit: Payer: Self-pay

## 2024-03-14 ENCOUNTER — Observation Stay (HOSPITAL_BASED_OUTPATIENT_CLINIC_OR_DEPARTMENT_OTHER)
Admission: EM | Admit: 2024-03-14 | Discharge: 2024-03-15 | Disposition: A | Discharge status: Home / Self Care | Attending: Internal Medicine | Admitting: Internal Medicine

## 2024-03-14 DIAGNOSIS — M329 Systemic lupus erythematosus, unspecified: Secondary | ICD-10-CM | POA: Diagnosis not present

## 2024-03-14 DIAGNOSIS — Z91040 Latex allergy status: Secondary | ICD-10-CM | POA: Insufficient documentation

## 2024-03-14 DIAGNOSIS — G43119 Migraine with aura, intractable, without status migrainosus: Secondary | ICD-10-CM

## 2024-03-14 DIAGNOSIS — G459 Transient cerebral ischemic attack, unspecified: Secondary | ICD-10-CM | POA: Diagnosis not present

## 2024-03-14 DIAGNOSIS — G4733 Obstructive sleep apnea (adult) (pediatric): Secondary | ICD-10-CM | POA: Insufficient documentation

## 2024-03-14 DIAGNOSIS — G43109 Migraine with aura, not intractable, without status migrainosus: Secondary | ICD-10-CM

## 2024-03-14 DIAGNOSIS — G43909 Migraine, unspecified, not intractable, without status migrainosus: Secondary | ICD-10-CM

## 2024-03-14 DIAGNOSIS — G43809 Other migraine, not intractable, without status migrainosus: Secondary | ICD-10-CM | POA: Diagnosis not present

## 2024-03-14 DIAGNOSIS — H5462 Unqualified visual loss, left eye, normal vision right eye: Principal | ICD-10-CM

## 2024-03-14 DIAGNOSIS — K219 Gastro-esophageal reflux disease without esophagitis: Secondary | ICD-10-CM | POA: Diagnosis present

## 2024-03-14 DIAGNOSIS — Z79899 Other long term (current) drug therapy: Secondary | ICD-10-CM | POA: Insufficient documentation

## 2024-03-14 DIAGNOSIS — R29818 Other symptoms and signs involving the nervous system: Secondary | ICD-10-CM | POA: Diagnosis not present

## 2024-03-14 DIAGNOSIS — Z87891 Personal history of nicotine dependence: Secondary | ICD-10-CM | POA: Diagnosis not present

## 2024-03-14 DIAGNOSIS — R519 Headache, unspecified: Secondary | ICD-10-CM | POA: Diagnosis not present

## 2024-03-14 LAB — BASIC METABOLIC PANEL WITH GFR
Anion gap: 9 (ref 5–15)
BUN: 15 mg/dL (ref 6–20)
CO2: 24 mmol/L (ref 22–32)
Calcium: 8.9 mg/dL (ref 8.9–10.3)
Chloride: 103 mmol/L (ref 98–111)
Creatinine, Ser: 0.76 mg/dL (ref 0.44–1.00)
GFR, Estimated: >60 mL/min (ref >60)
Glucose, Bld: 102 mg/dL — ABNORMAL HIGH (ref 70–99)
Potassium: 3.6 mmol/L (ref 3.5–5.1)
Sodium: 136 mmol/L (ref 135–145)

## 2024-03-14 LAB — CBC WITH DIFFERENTIAL/PLATELET
Abs Immature Granulocytes: 0.02 10*3/uL (ref 0.00–0.07)
Basophils Absolute: 0.1 10*3/uL (ref 0.0–0.1)
Basophils Relative: 1 %
Eosinophils Absolute: 0.1 10*3/uL (ref 0.0–0.5)
Eosinophils Relative: 1 %
HCT: 37.5 % (ref 36.0–46.0)
Hemoglobin: 13 g/dL (ref 12.0–15.0)
Immature Granulocytes: 0 %
Lymphocytes Relative: 28 %
Lymphs Abs: 2.2 10*3/uL (ref 0.7–4.0)
MCH: 32.1 pg (ref 26.0–34.0)
MCHC: 34.7 g/dL (ref 30.0–36.0)
MCV: 92.6 fL (ref 80.0–100.0)
Monocytes Absolute: 0.8 10*3/uL (ref 0.1–1.0)
Monocytes Relative: 10 %
Neutro Abs: 4.8 10*3/uL (ref 1.7–7.7)
Neutrophils Relative %: 60 %
Platelets: 275 10*3/uL (ref 150–400)
RBC: 4.05 MIL/uL (ref 3.87–5.11)
RDW: 13.6 % (ref 11.5–15.5)
WBC: 8 10*3/uL (ref 4.0–10.5)
nRBC: 0 % (ref 0.0–0.2)

## 2024-03-14 MED ORDER — ACETAMINOPHEN 500 MG PO TABS
1000.0000 mg | ORAL_TABLET | Freq: Once | ORAL | Status: AC
  Administered 2024-03-14: 1000 mg via ORAL
  Filled 2024-03-14: qty 2

## 2024-03-14 MED ORDER — IOHEXOL 350 MG/ML SOLN
75.0000 mL | Freq: Once | INTRAVENOUS | Status: AC | PRN
  Administered 2024-03-14: 75 mL via INTRAVENOUS

## 2024-03-14 NOTE — ED Notes (Signed)
 ..ED TO INPATIENT HANDOFF REPORT  ED Nurse Name and Phone #: [phone]  S Name/Age/Gender Wendy Spencer 48 y.o. female Room/Bed: MH04/MH04  Code Status   Code Status: Not on file  Home/SNF/Other Home Patient oriented to: self, place, time, and situation Is this baseline? Yes   Triage Complete: Triage complete  Chief Complaint TIA (transient ischemic attack) [G45.9]  Triage Note Pt reports blurry vision to LT eye x 15 minutes about 30 min PTA; reports HA started at that time, HA is worse now, but vision is back to normal   Allergies Allergies  Allergen Reactions   Fluogen [Influenza Virus Vaccine] Shortness Of Breath and Rash   Haemophilus B Polysaccharide Vaccine Rash and Shortness Of Breath   Macrobid [Nitrofurantoin] Shortness Of Breath   Phenazopyridine Hcl Shortness Of Breath   Pyridium [Phenazopyridine] Shortness Of Breath   Latex Dermatitis    Other reaction(s): Contact Dermatitis (intolerance)   Naltrexone Hives   Avocado Other (See Comments)   Banana    Duloxetine Hcl Other (See Comments)    Headaches   Grape (Artificial) Flavoring Agent (Non-Screening)     Grape fruit    Kiwi Extract Other (See Comments)   Pylera [Bis Subcit-Metronid-Tetracyc] Itching    Level of Care/Admitting Diagnosis ED Disposition     ED Disposition  Admit   Condition  --   Comment  Hospital Area: [facility] [[id number]]  Level of Care: Telemetry Medical [104]  Interfacility transfer: Yes  May place patient in observation at [facility] or [facility] if equivalent level of care is available:: No  Covid Evaluation: Asymptomatic - no recent exposure (last 10 days) testing not required  Diagnosis: TIA (transient ischemic attack) [098119]  Admitting Physician: Tereasa Coop [[id number]]  Attending Physician: Tereasa Coop [[id number]]          B Medical/Surgery History Past Medical History:  Diagnosis Date   Achilles tendinitis of left lower  extremity 04/22/2017   Allergic dermatitis 08/05/2021   Last Assessment & Plan: Formatting of this note might be different from the original. Symptoms consistent with allergic dermatitis Take steriods as prescribed Stay out of the sun/heat  worsening of current symptoms, present to the ER for further evaluation and treatment. Patient verbalized understanding and agreed with this plan of care.   Anemia    Anxiety    Depression    Fibromyalgia    GERD (gastroesophageal reflux disease)    Hereditary hemochromatosis (HCC)    Insomnia    Lupus    Obstructive sleep apnea on CPAP 07/18/2016   Rosacea    Systemic lupus erythematosus (HCC)    Vitamin D deficiency    Past Surgical History:  Procedure Laterality Date   CHOLECYSTECTOMY  2008   DILATION AND CURETTAGE OF UTERUS  1994   LAPAROSCOPIC GASTRIC SLEEVE RESECTION  2017     A IV Location/Drains/Wounds Patient Lines/Drains/Airways Status     Active Line/Drains/Airways     Name Placement date Placement time Site Days   Peripheral IV 03/14/24 20 G Right Antecubital 03/14/24  1808  Antecubital  less than 1            Intake/Output Last 24 hours No intake or output data in the 24 hours ending 03/14/24 2158  Labs/Imaging Results for orders placed or performed during the hospital encounter of 03/14/24 (from the past 48 hours)  Basic metabolic panel     Status: Abnormal   Collection Time: 03/14/24  6:02 PM  Result Value Ref  Range   Sodium 136 135 - 145 mmol/L   Potassium 3.6 3.5 - 5.1 mmol/L   Chloride 103 98 - 111 mmol/L   CO2 24 22 - 32 mmol/L   Glucose, Bld 102 (H) 70 - 99 mg/dL    Comment: Glucose reference range applies only to samples taken after fasting for at least 8 hours.   BUN 15 6 - 20 mg/dL   Creatinine, Ser 9.14 0.44 - 1.00 mg/dL   Calcium 8.9 8.9 - 78.2 mg/dL   GFR, Estimated >95 >62 mL/min    Comment: (NOTE) Calculated using the CKD-EPI Creatinine Equation (2021)    Anion gap 9 5 - 15    Comment:  Performed at [facility], [address] [place], [place] [address]  CBC with Differential     Status: None   Collection Time: 03/14/24  6:02 PM  Result Value Ref Range   WBC 8.0 4.0 - 10.5 K/uL   RBC 4.05 3.87 - 5.11 MIL/uL   Hemoglobin 13.0 12.0 - 15.0 g/dL   HCT 57.8 46.9 - 62.9 %   MCV 92.6 80.0 - 100.0 fL   MCH 32.1 26.0 - 34.0 pg   MCHC 34.7 30.0 - 36.0 g/dL   RDW 52.8 41.3 - 24.4 %   Platelets 275 150 - 400 K/uL   nRBC 0.0 0.0 - 0.2 %   Neutrophils Relative % 60 %   Neutro Abs 4.8 1.7 - 7.7 K/uL   Lymphocytes Relative 28 %   Lymphs Abs 2.2 0.7 - 4.0 K/uL   Monocytes Relative 10 %   Monocytes Absolute 0.8 0.1 - 1.0 K/uL   Eosinophils Relative 1 %   Eosinophils Absolute 0.1 0.0 - 0.5 K/uL   Basophils Relative 1 %   Basophils Absolute 0.1 0.0 - 0.1 K/uL   Immature Granulocytes 0 %   Abs Immature Granulocytes 0.02 0.00 - 0.07 K/uL    Comment: Performed at [facility], [address] [place], [place] [address]   CT ANGIO HEAD NECK W WO CM Result Date: 03/14/2024 CLINICAL DATA:  Neuro deficit, acute, stroke suspected; Dural venous sinus thrombosis suspected EXAM: CT ANGIOGRAPHY HEAD AND NECK WITH AND WITHOUT CONTRAST CT VENOGRAM TECHNIQUE: Multidetector CT imaging of the head and neck was performed using the standard protocol during bolus administration of intravenous contrast. Multiplanar CT image reconstructions and MIPs were obtained to evaluate the vascular anatomy. Carotid stenosis measurements (when applicable) are obtained utilizing NASCET criteria, using the distal internal carotid diameter as the denominator. Venographic phase images of the brain were obtained following the administration of intravenous contrast. Multiplanar reformats and maximum intensity projections were generated. RADIATION DOSE REDUCTION: This exam was performed according to the departmental dose-optimization program which includes automated exposure control, adjustment of the mA  and/or kV according to patient size and/or use of iterative reconstruction technique. CONTRAST:  75mL OMNIPAQUE IOHEXOL 350 MG/ML SOLN COMPARISON:  None Available. FINDINGS: CT HEAD FINDINGS Brain: No evidence of acute infarction, hemorrhage, hydrocephalus, extra-axial collection or mass lesion/mass effect. Vascular: See below. Skull: No acute fracture. Sinuses/Orbits: Clear sinuses.  No acute orbital findings. Other: No mastoid effusions. Review of the MIP images confirms the above findings CTA NECK FINDINGS Aortic arch: Great vessel origins are patent without significant stenosis. Right carotid system: No evidence of dissection, stenosis (50% or greater), or occlusion. Left carotid system: No evidence of dissection, stenosis (50% or greater), or occlusion. Vertebral arteries: Right-dominant. No evidence of dissection, stenosis (50%  or greater), or occlusion. Skeleton: No evidence of acute abnormality on limited assessment. Other neck: No evidence of acute abnormality on limited assessment. Upper chest: Visualized lung apices are clear. Review of the MIP images confirms the above findings CTA HEAD FINDINGS Anterior circulation: Bilateral intracranial ICAs, both MCAs, and ACAs are patent without proximal hemodynamically significant stenosis. Posterior circulation: Bilateral intradural vertebral arteries, basilar artery and bilateral posterior cerebral arteries are patent without proximal hemodynamically significant stenosis. Venous sinuses: See below. Review of the MIP images confirms the above findings CT VENOGRAM No evidence of dural venous sinus thrombosis. Specifically, the superior sagittal, straight, sigmoid, and transverse sinuses are patent. Visualized deep cerebral veins are patent. Symmetric cavernous sinus opacification. IMPRESSION: 1. No evidence of acute intracranial abnormality. 2. No large vessel occlusion or proximal hemodynamically significant stenosis. 3. No evidence of dural venous sinus  thrombosis. Electronically Signed   By: Feliberto Harts M.D.   On: 03/14/2024 20:10   CT VENOGRAM HEAD Result Date: 03/14/2024 CLINICAL DATA:  Neuro deficit, acute, stroke suspected; Dural venous sinus thrombosis suspected EXAM: CT ANGIOGRAPHY HEAD AND NECK WITH AND WITHOUT CONTRAST CT VENOGRAM TECHNIQUE: Multidetector CT imaging of the head and neck was performed using the standard protocol during bolus administration of intravenous contrast. Multiplanar CT image reconstructions and MIPs were obtained to evaluate the vascular anatomy. Carotid stenosis measurements (when applicable) are obtained utilizing NASCET criteria, using the distal internal carotid diameter as the denominator. Venographic phase images of the brain were obtained following the administration of intravenous contrast. Multiplanar reformats and maximum intensity projections were generated. RADIATION DOSE REDUCTION: This exam was performed according to the departmental dose-optimization program which includes automated exposure control, adjustment of the mA and/or kV according to patient size and/or use of iterative reconstruction technique. CONTRAST:  75mL OMNIPAQUE IOHEXOL 350 MG/ML SOLN COMPARISON:  None Available. FINDINGS: CT HEAD FINDINGS Brain: No evidence of acute infarction, hemorrhage, hydrocephalus, extra-axial collection or mass lesion/mass effect. Vascular: See below. Skull: No acute fracture. Sinuses/Orbits: Clear sinuses.  No acute orbital findings. Other: No mastoid effusions. Review of the MIP images confirms the above findings CTA NECK FINDINGS Aortic arch: Great vessel origins are patent without significant stenosis. Right carotid system: No evidence of dissection, stenosis (50% or greater), or occlusion. Left carotid system: No evidence of dissection, stenosis (50% or greater), or occlusion. Vertebral arteries: Right-dominant. No evidence of dissection, stenosis (50% or greater), or occlusion. Skeleton: No evidence of acute  abnormality on limited assessment. Other neck: No evidence of acute abnormality on limited assessment. Upper chest: Visualized lung apices are clear. Review of the MIP images confirms the above findings CTA HEAD FINDINGS Anterior circulation: Bilateral intracranial ICAs, both MCAs, and ACAs are patent without proximal hemodynamically significant stenosis. Posterior circulation: Bilateral intradural vertebral arteries, basilar artery and bilateral posterior cerebral arteries are patent without proximal hemodynamically significant stenosis. Venous sinuses: See below. Review of the MIP images confirms the above findings CT VENOGRAM No evidence of dural venous sinus thrombosis. Specifically, the superior sagittal, straight, sigmoid, and transverse sinuses are patent. Visualized deep cerebral veins are patent. Symmetric cavernous sinus opacification. IMPRESSION: 1. No evidence of acute intracranial abnormality. 2. No large vessel occlusion or proximal hemodynamically significant stenosis. 3. No evidence of dural venous sinus thrombosis. Electronically Signed   By: Feliberto Harts M.D.   On: 03/14/2024 20:10    Pending Labs Unresulted Labs (From admission, onward)    None       Vitals/Pain Today's Vitals   03/14/24 1935 03/14/24  1942 03/14/24 2100 03/14/24 2155  BP: (!) 111/59   120/67  Pulse: 63   63  Resp: 18   18  Temp:    98.4 F (36.9 C)  TempSrc:      SpO2: 99% 99%  100%  Weight:      Height:      PainSc:   0-No pain     Isolation Precautions No active isolations  Medications Medications  acetaminophen (TYLENOL) tablet 1,000 mg (1,000 mg Oral Given 03/14/24 1858)  iohexol (OMNIPAQUE) 350 MG/ML injection 75 mL (75 mLs Intravenous Contrast Given 03/14/24 1921)    Mobility walks     Focused Assessments Neuro Assessment Handoff:  Swallow screen pass? Yes  Cardiac Rhythm: Normal sinus rhythm       Neuro Assessment: Exceptions to WDL Neuro Checks:      Has TPA been given?  No If patient is a Neuro Trauma and patient is going to OR before floor call report to 4N Charge nurse: [phone] or [phone]   R Recommendations: See Admitting Provider Note  Report given to:   Additional Notes: pt alert and oriented x 4, no pain at the present.

## 2024-03-14 NOTE — ED Notes (Signed)
 [facility] called for transport.

## 2024-03-14 NOTE — Plan of Care (Signed)
 Plan of Care Note for accepted transfer  Patient: Wendy Spencer              ZOX:[id number]  DOA: 03/14/2024     Facility requesting transfer: [facility] emergency department Requesting Provider: Rolan Bucco, MD    Reason for transfer: Transient ischemic attack  ED triage note:  Pt reports blurry vision to LT eye x 15 minutes about 30 min PTA; reports HA started at that time, HA is worse now, but vision is back to normal            Facility course: 48 year old female history of fibromyalgia, lupus, hereditary  hemochromatosis presented emergency department complaining of headache and vision change.   Patient reported she has trouble patient on the either left or bilateral eye.  She could not tell which I was involved but when she was looking at her keyboard in her hand, she could not see anything on the left side. About 5 minutes after that, she started having headache on the right side of her head. It is in her right forehead and back to her right mid head area. She said the vision changes lasted about 15 minutes and then resolved. She has not had any further episodes of blurry vision or vision changes. She denies any speech deficits or slurred speech. No numbness or weakness to her extremities. No difficulty with her balance. No history of similar issues. She has tension type headaches but she says she has not had a migrainous type headache in about 15+ years. No other headaches that were similar to this. No associated neck pain. No fevers. No nausea or vomiting.   At presentation to ED patient is hemodynamically stable. CBC and BMP unremarkable.  CTA head and neck and CT venogram head no evidence of acute intracranial abnormality.  No evidence of dural venous sinus thrombosis.  No large vessel occlusion.  ED physician Dr. Fredderick Phenix has been consulted both neurology and ophthalmology.  Both of the specialist recommended given patient has history of lupus admit patient for  TIA workup and need MRI of the brain.  Hospitalist has been consulted for further evaluation management of blurry vision and headache which has been resolved-concern for transient ischemic attack.    Plan of care: The patient is accepted for admission for observation status to Telemetry unit, at [facility].  [facility] will assume care on arrival to accepting facility. Until arrival, care as per EDP. However, [facility] available 24/7 for questions and assistance.   Check [email or web address] for on-call coverage.   Nursing staff, please call [facility] Admits & Consults System-Wide number under Amion on patient's arrival so appropriate admitting provider can evaluate the pt.    Author: Tereasa Coop, MD  03/14/2024  [facility]

## 2024-03-14 NOTE — ED Triage Notes (Signed)
 Pt reports blurry vision to LT eye x 15 minutes about 30 min PTA; reports HA started at that time, HA is worse now, but vision is back to normal

## 2024-03-14 NOTE — ED Notes (Addendum)
 Pt back from ct scan. Said that her head is feeling better, awaiting results.

## 2024-03-14 NOTE — ED Notes (Signed)
 Pt care taken, is getting ready to go to CT scan.

## 2024-03-14 NOTE — ED Provider Notes (Signed)
 [facility] Provider Note   CSN: [id number] Arrival date & time: 03/14/24  1739     History {Add pertinent medical, surgical, social history, OB history to HPI:1} Chief Complaint  Patient presents with   Headache    Wendy Spencer is a 48 y.o. female.  Patient is a 48 year old female with a history of fibromyalgia, lupus, hereditary hemochromatosis who presents with a headache and vision changes.  She states that about 5 15-5 30 today she noted suddenly that she was having trouble seeing on the left side.  She could not tell which I was involved but when she was looking at her keyboard in her hand, she could not see anything on the left side.  About 5 minutes after that, she started having headache on the right side of her head.  It is in her right forehead and back to her right mid head area.  She said the vision changes lasted about 15 minutes and then resolved.  She has not had any further episodes of blurry vision or vision changes.  She denies any speech deficits or slurred speech.  No numbness or weakness to her extremities.  No difficulty with her balance.  No history of similar issues.  She has tension type headaches but she says she has not had a migrainous type headache in about 15+ years.  No other headaches that were similar to this.  No associated neck pain.  No fevers.  No nausea or vomiting.       Home Medications Prior to Admission medications   Medication Sig Start Date End Date Taking? Authorizing Provider  ALPRAZolam (XANAX) 0.25 MG tablet Take 0.25 mg by mouth 3 (three) times daily as needed for anxiety.    [provider]  FLUoxetine (PROZAC) 10 MG capsule Take 10 mg by mouth daily. 01/21/23   [provider]  hydroxychloroquine (PLAQUENIL) 200 MG tablet Take 400 mg by mouth daily. 12/20/15   [provider]  ondansetron (ZOFRAN-ODT) 4 MG disintegrating tablet Take 4 mg by mouth every 8  (eight) hours as needed for nausea or vomiting. 08/26/20   [provider]  pantoprazole (PROTONIX) 40 MG tablet Take 1 tablet by mouth daily. 03/09/21   [provider]  valACYclovir (VALTREX) 1000 MG tablet Take 2,000 mg by mouth every 12 (twelve) hours as needed (Fever blisters). 03/22/21   [provider]  Vitamin D, Ergocalciferol, (DRISDOL) 1.25 MG (50000 UNIT) CAPS capsule Take 50,000 Units by mouth once a week. 03/25/23   [provider]      Allergies    Fluogen [influenza virus vaccine], Haemophilus b polysaccharide vaccine, Macrobid [nitrofurantoin], Phenazopyridine hcl, Pyridium [phenazopyridine], Latex, Naltrexone, Avocado, Banana, Duloxetine hcl, Grape (artificial) flavoring agent (non-screening), Kiwi extract, and Pylera [bis subcit-metronid-tetracyc]    Review of Systems   Review of Systems  Constitutional:  Negative for chills, diaphoresis, fatigue and fever.  HENT:  Negative for congestion, rhinorrhea and sneezing.   Eyes:  Positive for visual disturbance. Negative for pain and redness.  Respiratory:  Negative for cough, chest tightness and shortness of breath.   Cardiovascular:  Negative for chest pain and leg swelling.  Gastrointestinal:  Negative for abdominal pain, blood in stool, diarrhea, nausea and vomiting.  Genitourinary:  Negative for difficulty urinating, flank pain, frequency and hematuria.  Musculoskeletal:  Negative for arthralgias and back pain.  Skin:  Negative for rash.  Neurological:  Positive for headaches. Negative for dizziness, speech difficulty, weakness  and numbness.    Physical Exam Updated Vital Signs BP 118/78 (BP Location: Right Arm)   Pulse 73   Temp 98.9 F (37.2 C) (Oral)   Resp 18   Ht 5\' 4"  (1.626 m)   Wt 86.2 kg   SpO2 98%   BMI 32.61 kg/m  Physical Exam Constitutional:      Appearance: She is well-developed.  HENT:     Head: Normocephalic and atraumatic.  Eyes:     General: No visual field  deficit.    Pupils: Pupils are equal, round, and reactive to light.  Cardiovascular:     Rate and Rhythm: Normal rate and regular rhythm.     Heart sounds: Murmur (Slight murmur) heard.  Pulmonary:     Effort: Pulmonary effort is normal. No respiratory distress.     Breath sounds: Normal breath sounds. No wheezing or rales.  Chest:     Chest wall: No tenderness.  Abdominal:     General: Bowel sounds are normal.     Palpations: Abdomen is soft.     Tenderness: There is no abdominal tenderness. There is no guarding or rebound.  Musculoskeletal:        General: Normal range of motion.     Cervical back: Normal range of motion and neck supple.  Lymphadenopathy:     Cervical: No cervical adenopathy.  Skin:    General: Skin is warm and dry.     Findings: No rash.  Neurological:     Mental Status: She is alert and oriented to person, place, and time.     Cranial Nerves: No cranial nerve deficit, dysarthria or facial asymmetry.     Sensory: No sensory deficit.     Motor: No weakness.     Coordination: Coordination normal.     ED Results / Procedures / Treatments   Labs (all labs ordered are listed, but only abnormal results are displayed) Labs Reviewed  BASIC METABOLIC PANEL WITH GFR  CBC WITH DIFFERENTIAL/PLATELET    EKG None  Radiology No results found.  Procedures Procedures  {Document cardiac monitor, telemetry assessment procedure when appropriate:1}  Medications Ordered in ED Medications - No data to display  ED Course/ Medical Decision Making/ A&P   {   Click here for ABCD2, HEART and other calculatorsREFRESH Note before signing :1}                              Medical Decision Making Amount and/or Complexity of Data Reviewed Labs: ordered. Radiology: ordered.   ***  {Document critical care time when appropriate:1} {Document review of labs and clinical decision tools ie heart score, Chads2Vasc2 etc:1}  {Document your independent review of radiology  images, and any outside records:1} {Document your discussion with family members, caretakers, and with consultants:1} {Document social determinants of health affecting pt's care:1} {Document your decision making why or why not admission, treatments were needed:1} Final Clinical Impression(s) / ED Diagnoses Final diagnoses:  None    Rx / DC Orders ED Discharge Orders     None

## 2024-03-15 ENCOUNTER — Observation Stay (HOSPITAL_BASED_OUTPATIENT_CLINIC_OR_DEPARTMENT_OTHER)

## 2024-03-15 ENCOUNTER — Observation Stay (HOSPITAL_COMMUNITY)

## 2024-03-15 ENCOUNTER — Encounter (HOSPITAL_COMMUNITY): Payer: Self-pay

## 2024-03-15 DIAGNOSIS — G459 Transient cerebral ischemic attack, unspecified: Secondary | ICD-10-CM

## 2024-03-15 DIAGNOSIS — G4733 Obstructive sleep apnea (adult) (pediatric): Secondary | ICD-10-CM

## 2024-03-15 DIAGNOSIS — G43809 Other migraine, not intractable, without status migrainosus: Secondary | ICD-10-CM

## 2024-03-15 DIAGNOSIS — K219 Gastro-esophageal reflux disease without esophagitis: Secondary | ICD-10-CM

## 2024-03-15 DIAGNOSIS — H5462 Unqualified visual loss, left eye, normal vision right eye: Secondary | ICD-10-CM | POA: Diagnosis not present

## 2024-03-15 DIAGNOSIS — R29818 Other symptoms and signs involving the nervous system: Secondary | ICD-10-CM | POA: Diagnosis not present

## 2024-03-15 DIAGNOSIS — M329 Systemic lupus erythematosus, unspecified: Secondary | ICD-10-CM

## 2024-03-15 DIAGNOSIS — G43909 Migraine, unspecified, not intractable, without status migrainosus: Secondary | ICD-10-CM

## 2024-03-15 DIAGNOSIS — G43109 Migraine with aura, not intractable, without status migrainosus: Secondary | ICD-10-CM

## 2024-03-15 LAB — HEMOGLOBIN A1C
Hgb A1c MFr Bld: 5 % (ref 4.8–5.6)
Mean Plasma Glucose: 96.8 mg/dL

## 2024-03-15 LAB — ECHOCARDIOGRAM COMPLETE
Area-P 1/2: 4.21 cm2
Height: 64 in
S' Lateral: 3.4 cm
Weight: 3040 [oz_av]

## 2024-03-15 LAB — RAPID URINE DRUG SCREEN, HOSP PERFORMED
Amphetamines: NOT DETECTED
Barbiturates: NOT DETECTED
Benzodiazepines: NOT DETECTED
Cocaine: NOT DETECTED
Opiates: NOT DETECTED
Tetrahydrocannabinol: NOT DETECTED

## 2024-03-15 LAB — LIPID PANEL
Cholesterol: 147 mg/dL (ref 0–200)
HDL: 62 mg/dL (ref >40)
LDL Cholesterol: 75 mg/dL (ref 0–99)
Total CHOL/HDL Ratio: 2.4 ratio
Triglycerides: 48 mg/dL (ref <150)
VLDL: 10 mg/dL (ref 0–40)

## 2024-03-15 LAB — C-REACTIVE PROTEIN: CRP: 0.7 mg/dL (ref <1.0)

## 2024-03-15 LAB — SEDIMENTATION RATE: Sed Rate: 20 mm/h (ref 0–22)

## 2024-03-15 LAB — HIV ANTIBODY (ROUTINE TESTING W REFLEX): HIV Screen 4th Generation wRfx: NONREACTIVE

## 2024-03-15 MED ORDER — ACETAMINOPHEN 325 MG PO TABS: 650.0000 mg | ORAL_TABLET | Freq: Four times a day (QID) | ORAL | Status: DC | PRN

## 2024-03-15 MED ORDER — PANTOPRAZOLE SODIUM 40 MG PO TBEC
40.0000 mg | DELAYED_RELEASE_TABLET | Freq: Every day | ORAL | Status: DC
  Administered 2024-03-15: 40 mg via ORAL
  Filled 2024-03-15: qty 1

## 2024-03-15 MED ORDER — ONDANSETRON 4 MG PO TBDP: 4.0000 mg | ORAL_TABLET | Freq: Four times a day (QID) | ORAL | Status: DC | PRN

## 2024-03-15 MED ORDER — ORAL CARE MOUTH RINSE: 15.0000 mL | OROMUCOSAL | Status: DC | PRN

## 2024-03-15 MED ORDER — FLUOXETINE HCL 10 MG PO CAPS: 10.0000 mg | ORAL_CAPSULE | Freq: Every day | ORAL | Status: DC

## 2024-03-15 MED ORDER — CLOPIDOGREL BISULFATE 75 MG PO TABS
75.0000 mg | ORAL_TABLET | Freq: Every day | ORAL | Status: DC
  Administered 2024-03-15: 75 mg via ORAL
  Filled 2024-03-15: qty 1

## 2024-03-15 MED ORDER — ACETAMINOPHEN 325 MG PO TABS: 650.0000 mg | ORAL_TABLET | Freq: Four times a day (QID) | ORAL | Status: AC | PRN

## 2024-03-15 MED ORDER — VALACYCLOVIR HCL 500 MG PO TABS: 2000.0000 mg | ORAL_TABLET | Freq: Two times a day (BID) | ORAL | Status: DC | PRN

## 2024-03-15 MED ORDER — ASPIRIN 81 MG PO TBEC
81.0000 mg | DELAYED_RELEASE_TABLET | Freq: Every day | ORAL | Status: DC
  Administered 2024-03-15: 81 mg via ORAL
  Filled 2024-03-15: qty 1

## 2024-03-15 MED ORDER — ATORVASTATIN CALCIUM 40 MG PO TABS
40.0000 mg | ORAL_TABLET | Freq: Every day | ORAL | Status: DC
  Administered 2024-03-15: 40 mg via ORAL
  Filled 2024-03-15: qty 1

## 2024-03-15 MED ORDER — ASPIRIN 81 MG PO TBEC: 81.0000 mg | DELAYED_RELEASE_TABLET | Freq: Every day | ORAL | 0 refills | Status: AC

## 2024-03-15 MED ORDER — ONDANSETRON 4 MG PO TBDP
4.0000 mg | ORAL_TABLET | Freq: Four times a day (QID) | ORAL | Status: DC | PRN
  Administered 2024-03-15: 4 mg via ORAL
  Filled 2024-03-15: qty 1

## 2024-03-15 MED ORDER — SODIUM CHLORIDE 0.9 % IV BOLUS
1000.0000 mL | Freq: Once | INTRAVENOUS | Status: AC
  Administered 2024-03-15: 1000 mL via INTRAVENOUS

## 2024-03-15 MED ORDER — CLOPIDOGREL BISULFATE 75 MG PO TABS: 75.0000 mg | ORAL_TABLET | Freq: Every day | ORAL | 0 refills | Status: AC

## 2024-03-15 MED ORDER — HYDROXYCHLOROQUINE SULFATE 200 MG PO TABS
400.0000 mg | ORAL_TABLET | Freq: Every day | ORAL | Status: DC
  Administered 2024-03-15: 400 mg via ORAL
  Filled 2024-03-15: qty 2

## 2024-03-15 MED ORDER — ENOXAPARIN SODIUM 40 MG/0.4ML IJ SOSY
40.0000 mg | PREFILLED_SYRINGE | INTRAMUSCULAR | Status: DC
Start: 2024-03-15 — End: 2024-03-15
  Administered 2024-03-15: 40 mg via SUBCUTANEOUS
  Filled 2024-03-15: qty 0.4

## 2024-03-15 MED ORDER — ONDANSETRON HCL 4 MG/2ML IJ SOLN: 4.0000 mg | Freq: Four times a day (QID) | INTRAMUSCULAR | Status: DC | PRN

## 2024-03-15 MED ORDER — VITAMIN D (ERGOCALCIFEROL) 1.25 MG (50000 UNIT) PO CAPS
50000.0000 [IU] | ORAL_CAPSULE | ORAL | Status: DC
  Administered 2024-03-15: 50000 [IU] via ORAL
  Filled 2024-03-15: qty 1

## 2024-03-15 MED ORDER — ALPRAZOLAM 0.25 MG PO TABS: 0.2500 mg | ORAL_TABLET | Freq: Three times a day (TID) | ORAL | Status: DC | PRN

## 2024-03-15 NOTE — Progress Notes (Signed)
 PROGRESS NOTE    Cande Mastropietro  ZOX:[id number] DOB: 03-Apr-1976 DOA: 03/14/2024 PCP: Philemon Kingdom, MD    Chief Complaint  Patient presents with   Headache    Brief Narrative: Patient 48 year old female history of hemochromatosis, SLE, OSA presented with sudden onset of vision loss in the left eye lasting about 10 to 15 minutes with spontaneous resolution.  Patient presented to the ED CT head, MRI brain, CT angiogram head and neck with no acute abnormalities.  Patient placed on aspirin and Plavix and admitted for TIA/stroke workup.  Neurology consulted and following.   Assessment & Plan:   Principal Problem:   Vision loss, left eye Active Problems:   Hereditary hemochromatosis (HCC)   Systemic lupus erythematosus (HCC)   GERD (gastroesophageal reflux disease)   OSA (obstructive sleep apnea)   TIA (transient ischemic attack)   Migraine   #1 TIA versus migraine with aura and scotoma versus CRAO with accompanying amaurosis fugax -Patient noted to have presented with sudden onset visual loss in the left eye lasting 10 to 15 minutes with spontaneous resolution. -CT head done negative for any acute abnormalities. -CT angiogram head and neck done with no LVO. -MRI brain with no acute abnormalities. -2D echo pending -Lower extremity Dopplers pending. -TCD with bubble studies pending. -CRP, sed rate within normal limits. -Hypercoagulable panel of homocystine, ANA cardiolipin, beta-2, lupus anticoagulant pending. -Hemoglobin A1c 5.0. -Fasting lipid panel with LDL of 75. -Patient seen in consultation by neurology who recommended completion of stroke workup, patient started on aspirin 81 mg daily and clopidogrel 75 mg daily x 3 weeks then aspirin alone due to concerns for possible TIA. -PT/OT/SLP. -Per neurology.  2.  Hyperlipidemia -Not on any medications prior to admission. -LDL noted at 75 with goal < 70. -Patient started on Lipitor 40 mg daily. -Outpatient  follow-up with PCP.  3.  GERD -PPI.  4.  OSA -CPAP nightly  5.SLE/ -Continue home regimen Plaquenil   DVT prophylaxis: Lovenox Code Status: Full Family Communication: Updated patient and boyfriend at bedside. Disposition: Likely home once stroke workup is completed, cleared by neurology.  Status is: Observation The patient remains OBS appropriate and will d/c before 2 midnights.   Consultants:  Neurology:Dr Derry Lory 03/15/2024  Procedures:  CT angiogram head and neck 03/14/2024 CT venogram 03/14/2024 MRI brain 03/15/2024 2D echo pending 03/15/2024 Lower extremity Dopplers pending TCD pending  Antimicrobials:  Anti-infectives (From admission, onward)    Start     Dose/Rate Route Frequency Ordered Stop   03/15/24 1215  hydroxychloroquine (PLAQUENIL) tablet 400 mg        400 mg Oral Daily 03/15/24 1127     03/15/24 1127  valACYclovir (VALTREX) tablet 2,000 mg        2,000 mg Oral Every 12 hours PRN 03/15/24 1127           Subjective: Patient sitting up in bed denies any further visual loss.  No asymmetric weakness or numbness.  No speech abnormalities.  Denies any chest pain or shortness of breath.  No abdominal pain.  Boyfriend at bedside.  Stated saw the stroke MD who feels she might have migraine headaches.  Objective: Vitals:   03/14/24 2354 03/15/24 0307 03/15/24 0748 03/15/24 1153  BP: (!) 111/59 103/65 96/60 108/63  Pulse: 78 (!) 59 63 67  Resp: 18 18 16 17   Temp: 98.6 F (37 C) 97.6 F (36.4 C) 98.6 F (37 C) 98.5 F (36.9 C)  TempSrc: Oral Oral Oral Oral  SpO2: 100%  99% 98% 99%  Weight:      Height:        Intake/Output Summary (Last 24 hours) at 03/15/2024 1418 Last data filed at 03/15/2024 1346 Gross per 24 hour  Intake 480 ml  Output --  Net 480 ml   Filed Weights   03/14/24 1745  Weight: 86.2 kg    Examination:  General exam: Appears calm and comfortable  Respiratory system: Clear to auscultation. Respiratory effort  normal. Cardiovascular system: S1 & S2 heard, RRR. No JVD, murmurs, rubs, gallops or clicks. No pedal edema. Gastrointestinal system: Abdomen is nondistended, soft and nontender. No organomegaly or masses felt. Normal bowel sounds heard. Central nervous system: Alert and oriented.  Moving extremities spontaneously.  Cranial nerves II through XII grossly intact.  No focal neurological deficits. Extremities: Symmetric 5 x 5 power. Skin: No rashes, lesions or ulcers Psychiatry: Judgement and insight appear normal. Mood & affect appropriate.     Data Reviewed: I have personally reviewed following labs and imaging studies  CBC: Recent Labs  Lab 03/14/24 1802  WBC 8.0  NEUTROABS 4.8  HGB 13.0  HCT 37.5  MCV 92.6  PLT 275    Basic Metabolic Panel: Recent Labs  Lab 03/14/24 1802  NA 136  K 3.6  CL 103  CO2 24  GLUCOSE 102*  BUN 15  CREATININE 0.76  CALCIUM 8.9    GFR: Estimated Creatinine Clearance: 90.4 mL/min (by C-G formula based on SCr of 0.76 mg/dL).  Liver Function Tests: No results for input(s): "AST", "ALT", "ALKPHOS", "BILITOT", "PROT", "ALBUMIN" in the last 168 hours.  CBG: No results for input(s): "GLUCAP" in the last 168 hours.   No results found for this or any previous visit (from the past 240 hours).       Radiology Studies: MR BRAIN WO CONTRAST Result Date: 03/15/2024 CLINICAL DATA:  Neuro deficit, acute, stroke suspected EXAM: MRI HEAD WITHOUT CONTRAST TECHNIQUE: Multiplanar, multiecho pulse sequences of the brain and surrounding structures were obtained without intravenous contrast. COMPARISON:  CTA head/neck March 14, 2024. FINDINGS: Brain: No acute infarction, hemorrhage, hydrocephalus, extra-axial collection or mass lesion. Vascular: Normal flow voids. Skull and upper cervical spine: Normal marrow signal. Sinuses/Orbits: Negative. Other: No mastoid effusions. IMPRESSION: Normal brain MRI. No acute abnormality. Electronically Signed   By: Feliberto Harts M.D.   On: 03/15/2024 02:49   CT ANGIO HEAD NECK W WO CM Result Date: 03/14/2024 CLINICAL DATA:  Neuro deficit, acute, stroke suspected; Dural venous sinus thrombosis suspected EXAM: CT ANGIOGRAPHY HEAD AND NECK WITH AND WITHOUT CONTRAST CT VENOGRAM TECHNIQUE: Multidetector CT imaging of the head and neck was performed using the standard protocol during bolus administration of intravenous contrast. Multiplanar CT image reconstructions and MIPs were obtained to evaluate the vascular anatomy. Carotid stenosis measurements (when applicable) are obtained utilizing NASCET criteria, using the distal internal carotid diameter as the denominator. Venographic phase images of the brain were obtained following the administration of intravenous contrast. Multiplanar reformats and maximum intensity projections were generated. RADIATION DOSE REDUCTION: This exam was performed according to the departmental dose-optimization program which includes automated exposure control, adjustment of the mA and/or kV according to patient size and/or use of iterative reconstruction technique. CONTRAST:  75mL OMNIPAQUE IOHEXOL 350 MG/ML SOLN COMPARISON:  None Available. FINDINGS: CT HEAD FINDINGS Brain: No evidence of acute infarction, hemorrhage, hydrocephalus, extra-axial collection or mass lesion/mass effect. Vascular: See below. Skull: No acute fracture. Sinuses/Orbits: Clear sinuses.  No acute orbital findings. Other: No mastoid effusions. Review  of the MIP images confirms the above findings CTA NECK FINDINGS Aortic arch: Great vessel origins are patent without significant stenosis. Right carotid system: No evidence of dissection, stenosis (50% or greater), or occlusion. Left carotid system: No evidence of dissection, stenosis (50% or greater), or occlusion. Vertebral arteries: Right-dominant. No evidence of dissection, stenosis (50% or greater), or occlusion. Skeleton: No evidence of acute abnormality on limited assessment.  Other neck: No evidence of acute abnormality on limited assessment. Upper chest: Visualized lung apices are clear. Review of the MIP images confirms the above findings CTA HEAD FINDINGS Anterior circulation: Bilateral intracranial ICAs, both MCAs, and ACAs are patent without proximal hemodynamically significant stenosis. Posterior circulation: Bilateral intradural vertebral arteries, basilar artery and bilateral posterior cerebral arteries are patent without proximal hemodynamically significant stenosis. Venous sinuses: See below. Review of the MIP images confirms the above findings CT VENOGRAM No evidence of dural venous sinus thrombosis. Specifically, the superior sagittal, straight, sigmoid, and transverse sinuses are patent. Visualized deep cerebral veins are patent. Symmetric cavernous sinus opacification. IMPRESSION: 1. No evidence of acute intracranial abnormality. 2. No large vessel occlusion or proximal hemodynamically significant stenosis. 3. No evidence of dural venous sinus thrombosis. Electronically Signed   By: Feliberto Harts M.D.   On: 03/14/2024 20:10   CT VENOGRAM HEAD Result Date: 03/14/2024 CLINICAL DATA:  Neuro deficit, acute, stroke suspected; Dural venous sinus thrombosis suspected EXAM: CT ANGIOGRAPHY HEAD AND NECK WITH AND WITHOUT CONTRAST CT VENOGRAM TECHNIQUE: Multidetector CT imaging of the head and neck was performed using the standard protocol during bolus administration of intravenous contrast. Multiplanar CT image reconstructions and MIPs were obtained to evaluate the vascular anatomy. Carotid stenosis measurements (when applicable) are obtained utilizing NASCET criteria, using the distal internal carotid diameter as the denominator. Venographic phase images of the brain were obtained following the administration of intravenous contrast. Multiplanar reformats and maximum intensity projections were generated. RADIATION DOSE REDUCTION: This exam was performed according to the  departmental dose-optimization program which includes automated exposure control, adjustment of the mA and/or kV according to patient size and/or use of iterative reconstruction technique. CONTRAST:  75mL OMNIPAQUE IOHEXOL 350 MG/ML SOLN COMPARISON:  None Available. FINDINGS: CT HEAD FINDINGS Brain: No evidence of acute infarction, hemorrhage, hydrocephalus, extra-axial collection or mass lesion/mass effect. Vascular: See below. Skull: No acute fracture. Sinuses/Orbits: Clear sinuses.  No acute orbital findings. Other: No mastoid effusions. Review of the MIP images confirms the above findings CTA NECK FINDINGS Aortic arch: Great vessel origins are patent without significant stenosis. Right carotid system: No evidence of dissection, stenosis (50% or greater), or occlusion. Left carotid system: No evidence of dissection, stenosis (50% or greater), or occlusion. Vertebral arteries: Right-dominant. No evidence of dissection, stenosis (50% or greater), or occlusion. Skeleton: No evidence of acute abnormality on limited assessment. Other neck: No evidence of acute abnormality on limited assessment. Upper chest: Visualized lung apices are clear. Review of the MIP images confirms the above findings CTA HEAD FINDINGS Anterior circulation: Bilateral intracranial ICAs, both MCAs, and ACAs are patent without proximal hemodynamically significant stenosis. Posterior circulation: Bilateral intradural vertebral arteries, basilar artery and bilateral posterior cerebral arteries are patent without proximal hemodynamically significant stenosis. Venous sinuses: See below. Review of the MIP images confirms the above findings CT VENOGRAM No evidence of dural venous sinus thrombosis. Specifically, the superior sagittal, straight, sigmoid, and transverse sinuses are patent. Visualized deep cerebral veins are patent. Symmetric cavernous sinus opacification. IMPRESSION: 1. No evidence of acute intracranial abnormality. 2. No large vessel  occlusion or proximal hemodynamically significant stenosis. 3. No evidence of dural venous sinus thrombosis. Electronically Signed   By: Feliberto Harts M.D.   On: 03/14/2024 20:10        Scheduled Meds:  aspirin EC  81 mg Oral Daily   atorvastatin  40 mg Oral Daily   clopidogrel  75 mg Oral Daily   enoxaparin (LOVENOX) injection  40 mg Subcutaneous Q24H   hydroxychloroquine  400 mg Oral Daily   pantoprazole  40 mg Oral Daily   Vitamin D (Ergocalciferol)  50,000 Units Oral Weekly   Continuous Infusions:   LOS: 0 days    Time spent: 40 minutes    Ramiro Harvest, MD [facility]   To contact the attending provider between 7A-7P or the covering provider during after hours 7P-7A, please log into the web site [email or web address] and access using universal [place] password for that web site. If you do not have the password, please call the hospital operator.  03/15/2024, 2:18 PM

## 2024-03-15 NOTE — Progress Notes (Signed)
 Received pt via stretcher, transported by [facility], oriented to room ans surroundings.

## 2024-03-15 NOTE — Plan of Care (Signed)

## 2024-03-15 NOTE — Discharge Summary (Signed)
 Physician Discharge Summary  Wendy Spencer ZOX:[id number] DOB: Jan 22, 1976 DOA: 03/14/2024  PCP: Philemon Kingdom, MD  Admit date: 03/14/2024 Discharge date: 03/15/2024  Time spent: 60 minutes  Recommendations for Outpatient Follow-up:  Follow-up with [facility] in 4 weeks. Follow-up with Philemon Kingdom, MD in 2 weeks.  Hypercoagulable panel will need to be followed up upon as this was pending at time of discharge.   Discharge Diagnoses:  Principal Problem:   Vision loss, left eye Active Problems:   Complicated migraine   Hereditary hemochromatosis (HCC)   Systemic lupus erythematosus (HCC)   GERD (gastroesophageal reflux disease)   OSA (obstructive sleep apnea)   TIA (transient ischemic attack)   Migraine   Discharge Condition: Stable and improved.  Diet recommendation: Heart healthy  Filed Weights   03/14/24 1745  Weight: 86.2 kg    History of present illness:  HPI: Wendy Spencer is a 48 y.o. female with medical history significant of SLE, hemochromatosis, hyperlipidemia, anxiety/depression, GERD, OSA on CPAP presenting with a chief complaint of transient left eye vision loss.  Patient states yesterday around 4 or 5 PM she was watching TV when all of a sudden the vision in her left eye became blurry.  She was seeing a large blurry spot and was not able to see the actors on TV and when she looked down at her phone she was only able to see 1/3 of the screen.  A few minutes later she started having right-sided frontal headache.  Her vision went back to normal in about 15 to 20 minutes.  She was not having any difficulty with speech or weakness/paresthesias of arm or leg.  She was still having a headache when she arrived to the ED but it resolved after she received Tylenol.  She is starting to get a mild headache again and requesting additional Tylenol.  She takes hydrochloroquine for lupus and gets regular eye checkups from an  ophthalmologist.  She has no other complaints.  Denies fevers, cough, shortness of breath, chest pain, nausea, vomiting, abdominal pain, or diarrhea.   ED Course: Vital signs stable on arrival.  No significant lab abnormalities on CBC and BMP.  CTA head and neck negative for acute findings.  CT venogram head negative for dural venous sinus thrombosis. ED physician discussed the case with neurology (Dr. Derry Lory) - recommended admission for TIA workup.  Case was also discussed with ophthalmology (Dr. Sherrine Maples) who felt that TIA workup was appropriate.  Patient was given Tylenol.    Hospital Course:  #1  Complicated migraine with aura and scotoma versus CRAO with accompanying amaurosis fugax versus TIA   -Patient noted to have presented with sudden onset visual loss in the left eye lasting 10 to 15 minutes with spontaneous resolution. -CT head done negative for any acute abnormalities. -CT angiogram head and neck done with no LVO. -MRI brain with no acute abnormalities. -2D echo with a EF of 60 to 65%, NWMA, no atrial level shunt detected by color-flow Doppler. -Lower extremity Dopplers negative for DVT. -TCD with bubble studies negative.. -CRP, sed rate within normal limits. -Hypercoagulable panel of homocystine, ANA cardiolipin, beta-2, lupus anticoagulant pending. -Hemoglobin A1c 5.0. -Fasting lipid panel with LDL of 75. -Patient seen in consultation by neurology who recommended completion of stroke workup, patient started on aspirin 81 mg daily and clopidogrel 75 mg daily x 3 weeks then aspirin alone due to concerns for possible TIA. -Patient be discharged in stable and improved condition and will need to  follow-up with neurology in the outpatient setting in 4 weeks.   2.  Hyperlipidemia -Not on any medications prior to admission. -LDL noted at 75 with goal < 70. -Patient received Lipitor 40 mg during the hospitalization however will be discharged home with diet control per neurology  recommendations.  -Outpatient follow-up with PCP.   3.  GERD -Patient maintained on PPI.   4.  OSA -CPAP nightly   5.SLE/ -Patient maintained on home regimen Plaquenil    Procedures: CT angiogram head and neck 03/14/2024 CT venogram 03/14/2024 MRI brain 03/15/2024 2D echo pending 03/15/2024 Lower extremity Dopplers pending TCD pending  Consultations: Neurology:Dr Khaliqdina 03/15/2024  Discharge Exam: Vitals:   03/15/24 0748 03/15/24 1153  BP: 96/60 108/63  Pulse: 63 67  Resp: 16 17  Temp: 98.6 F (37 C) 98.5 F (36.9 C)  SpO2: 98% 99%    General: NAD Cardiovascular: RRR no murmurs rubs or gallops.  No JVD.  No lower extremity edema. Respiratory: Clear to auscultation bilaterally.  No wheezes, no crackles, no rhonchi.  Fair air movement.  Speaking in full sentences.  Discharge Instructions   Discharge Instructions     Ambulatory referral to Neurology   Complete by: As directed    An appointment is requested in approximately: 8 weeks   Diet - low sodium heart healthy   Complete by: As directed    Increase activity slowly   Complete by: As directed       Allergies as of 03/15/2024       Reactions   Fluogen [influenza Virus Vaccine] Shortness Of Breath, Rash   Haemophilus B Polysaccharide Vaccine Rash, Shortness Of Breath   Macrobid [nitrofurantoin] Shortness Of Breath   Phenazopyridine Hcl Shortness Of Breath   Pyridium [phenazopyridine] Shortness Of Breath   Latex Dermatitis   Other reaction(s): Contact Dermatitis (intolerance)   Naltrexone Hives   Avocado Other (See Comments)   Banana    Duloxetine Hcl Other (See Comments)   Headaches   Grape (artificial) Flavoring Agent (non-screening)    Grape fruit   Kiwi Extract Other (See Comments)   Pylera [bis Subcit-metronid-tetracyc] Itching        Medication List     TAKE these medications    acetaminophen 325 MG tablet Commonly known as: TYLENOL Take 2 tablets (650 mg total) by mouth every 6  (six) hours as needed for mild pain (pain score 1-3).   ALPRAZolam 0.25 MG tablet Commonly known as: XANAX Take 0.25 mg by mouth 3 (three) times daily as needed for anxiety.   aspirin EC 81 MG tablet Take 1 tablet (81 mg total) by mouth daily. Swallow whole. Start taking on: March 16, 2024   clopidogrel 75 MG tablet Commonly known as: PLAVIX Take 1 tablet (75 mg total) by mouth daily for 21 days. Start taking on: March 16, 2024   FLUoxetine 10 MG capsule Commonly known as: PROZAC Take 10 mg by mouth daily.   hydroxychloroquine 200 MG tablet Commonly known as: PLAQUENIL Take 400 mg by mouth daily.   ondansetron 4 MG disintegrating tablet Commonly known as: ZOFRAN-ODT Take 4 mg by mouth every 8 (eight) hours as needed for nausea or vomiting.   pantoprazole 40 MG tablet Commonly known as: PROTONIX Take 1 tablet by mouth daily.   valACYclovir 1000 MG tablet Commonly known as: VALTREX Take 2,000 mg by mouth every 12 (twelve) hours as needed (Fever blisters).   Vitamin D (Ergocalciferol) 1.25 MG (50000 UNIT) Caps capsule Commonly known as: DRISDOL Take  50,000 Units by mouth once a week.       Allergies  Allergen Reactions   Fluogen [Influenza Virus Vaccine] Shortness Of Breath and Rash   Haemophilus B Polysaccharide Vaccine Rash and Shortness Of Breath   Macrobid [Nitrofurantoin] Shortness Of Breath   Phenazopyridine Hcl Shortness Of Breath   Pyridium [Phenazopyridine] Shortness Of Breath   Latex Dermatitis    Other reaction(s): Contact Dermatitis (intolerance)   Naltrexone Hives   Avocado Other (See Comments)   Banana    Duloxetine Hcl Other (See Comments)    Headaches   Grape (Artificial) Flavoring Agent (Non-Screening)     Grape fruit    Kiwi Extract Other (See Comments)   Pylera [Bis Subcit-Metronid-Tetracyc] Itching    Follow-up Information     Prochnau, Rayfield Citizen, MD. Schedule an appointment as soon as possible for a visit in 2 week(s).   Specialty:  Internal Medicine Contact information: [address] [place] [address] [phone]         [facility]. Schedule an appointment as soon as possible for a visit in 4 week(s).   Specialty: Neurology Contact information: [address] [place] [address] [phone]                 The results of significant diagnostics from this hospitalization (including imaging, microbiology, ancillary and laboratory) are listed below for reference.    Significant Diagnostic Studies: VAS [place] TRANSCRANIAL DOPPLER W BUBBLES Result Date: 03/15/2024  Transcranial Doppler with Bubble Patient Name:  TAMANI DURNEY Contee  Date of Exam:   03/15/2024 Medical Rec #: [id number]              Accession #:    [id number] Date of Birth: September 18, 1976              Patient Gender: F Patient Age:   52 years Exam Location:  [facility] Procedure:      VAS [place] TRANSCRANIAL DOPPLER W BUBBLES Referring Phys: Scheryl Marten XU --------------------------------------------------------------------------------  Indications: TIA, visual disturbance, migrans, Lupus. Comparison Study: No prior study Performing Technologist: Sherren Kerns RVS  Examination Guidelines: A complete evaluation includes B-mode imaging, spectral Doppler, color Doppler, and power Doppler as needed of all accessible portions of each vessel. Bilateral testing is considered an integral part of a complete examination. Limited examinations for reoccurring indications may be performed as noted.  Summary: No HITS at rest or during Valsalva. Negative transcranial Doppler Bubble study with no evidence of right to left intracardiac communication.  A vascular evaluation was performed. The right middle cerebral artery was studied. An IV was inserted into the patient's right AC. Verbal informed consent was obtained.  *See table(s) above for TCD measurements and observations.    Preliminary    VAS [place] LOWER EXTREMITY  VENOUS (DVT) Result Date: 03/15/2024  Lower Venous DVT Study Patient Name:  ALIYA SOL  Date of Exam:   03/15/2024 Medical Rec #: [id number]              Accession #:    [id number] Date of Birth: 1976-07-02              Patient Gender: F Patient Age:   19 years Exam Location:  [facility] Procedure:      VAS [place] LOWER EXTREMITY VENOUS (DVT) Referring Phys: Scheryl Marten XU --------------------------------------------------------------------------------  Indications: TIA, visual disturbance, migrans, Lupus.  Comparison Study: No prior study Performing Technologist: Sherren Kerns RVS  Examination Guidelines: A complete evaluation  includes B-mode imaging, spectral Doppler, color Doppler, and power Doppler as needed of all accessible portions of each vessel. Bilateral testing is considered an integral part of a complete examination. Limited examinations for reoccurring indications may be performed as noted. The reflux portion of the exam is performed with the patient in reverse Trendelenburg.  +---------+---------------+---------+-----------+----------+--------------+ RIGHT    CompressibilityPhasicitySpontaneityPropertiesThrombus Aging +---------+---------------+---------+-----------+----------+--------------+ CFV      Full           Yes      Yes                                 +---------+---------------+---------+-----------+----------+--------------+ SFJ      Full                                                        +---------+---------------+---------+-----------+----------+--------------+ FV Prox  Full                                                        +---------+---------------+---------+-----------+----------+--------------+ FV Mid   Full                                                        +---------+---------------+---------+-----------+----------+--------------+ FV DistalFull                                                         +---------+---------------+---------+-----------+----------+--------------+ PFV      Full                                                        +---------+---------------+---------+-----------+----------+--------------+ POP      Full           Yes      Yes                                 +---------+---------------+---------+-----------+----------+--------------+ PTV      Full                                                        +---------+---------------+---------+-----------+----------+--------------+ PERO     Full                                                        +---------+---------------+---------+-----------+----------+--------------+  Gastroc  Full                                                        +---------+---------------+---------+-----------+----------+--------------+   +---------+---------------+---------+-----------+----------+--------------+ LEFT     CompressibilityPhasicitySpontaneityPropertiesThrombus Aging +---------+---------------+---------+-----------+----------+--------------+ CFV      Full           Yes      Yes                                 +---------+---------------+---------+-----------+----------+--------------+ SFJ      Full                                                        +---------+---------------+---------+-----------+----------+--------------+ FV Prox  Full                                                        +---------+---------------+---------+-----------+----------+--------------+ FV Mid   Full                                                        +---------+---------------+---------+-----------+----------+--------------+ FV DistalFull                                                        +---------+---------------+---------+-----------+----------+--------------+ PFV      Full                                                         +---------+---------------+---------+-----------+----------+--------------+ POP      Full           Yes      Yes                                 +---------+---------------+---------+-----------+----------+--------------+ PTV      Full                                                        +---------+---------------+---------+-----------+----------+--------------+ PERO     Full                                                        +---------+---------------+---------+-----------+----------+--------------+  Summary: BILATERAL: - No evidence of deep vein thrombosis seen in the lower extremities, bilaterally. -No evidence of popliteal cyst, bilaterally.   *See table(s) above for measurements and observations.    Preliminary    ECHOCARDIOGRAM COMPLETE Result Date: 03/15/2024    ECHOCARDIOGRAM REPORT   Patient Name:   KAYLENN CIVIL Soller Date of Exam: 03/15/2024 Medical Rec #:  [id number]             Height:       64.0 in Accession #:    [id number]            Weight:       190.0 lb Date of Birth:  [birth year]             BSA:          1.914 m Patient Age:    48 years              BP:           108/63 mmHg Patient Gender: F                     HR:           70 bpm. Exam Location:  Inpatient Procedure: 2D Echo, Color Doppler and Cardiac Doppler (Both Spectral and Color            Flow Doppler were utilized during procedure). Indications:    Stroke I63.9  History:        Patient has no prior history of Echocardiogram examinations.                 Risk Factors:Dyslipidemia.  Sonographer:    Harriette Bouillon RDCS Referring Phys: [id number] VASUNDHRA RATHORE IMPRESSIONS  1. Left ventricular ejection fraction, by estimation, is 60 to 65%. The left ventricle has normal function. The left ventricle has no regional wall motion abnormalities. Left ventricular diastolic parameters were normal.  2. Right ventricular systolic function is normal. The right ventricular size is normal.  3. The mitral valve is normal  in structure. No evidence of mitral valve regurgitation. No evidence of mitral stenosis.  4. The aortic valve is normal in structure. Aortic valve regurgitation is not visualized. No aortic stenosis is present.  5. The inferior vena cava is normal in size with greater than 50% respiratory variability, suggesting right atrial pressure of 3 mmHg. FINDINGS  Left Ventricle: Left ventricular ejection fraction, by estimation, is 60 to 65%. The left ventricle has normal function. The left ventricle has no regional wall motion abnormalities. The left ventricular internal cavity size was normal in size. There is  no left ventricular hypertrophy. Left ventricular diastolic parameters were normal. Right Ventricle: The right ventricular size is normal. No increase in right ventricular wall thickness. Right ventricular systolic function is normal. Left Atrium: Left atrial size was normal in size. Right Atrium: Right atrial size was normal in size. Pericardium: There is no evidence of pericardial effusion. Mitral Valve: The mitral valve is normal in structure. No evidence of mitral valve regurgitation. No evidence of mitral valve stenosis. Tricuspid Valve: The tricuspid valve is normal in structure. Tricuspid valve regurgitation is not demonstrated. No evidence of tricuspid stenosis. Aortic Valve: The aortic valve is normal in structure. Aortic valve regurgitation is not visualized. No aortic stenosis is present. Pulmonic Valve: The pulmonic valve was normal in structure. Pulmonic valve regurgitation is not visualized. No evidence of pulmonic stenosis. Aorta: The aortic root is normal in size and structure. Venous:  The inferior vena cava is normal in size with greater than 50% respiratory variability, suggesting right atrial pressure of 3 mmHg. IAS/Shunts: No atrial level shunt detected by color flow Doppler.  LEFT VENTRICLE PLAX 2D LVIDd:         4.60 cm   Diastology LVIDs:         3.40 cm   LV e' medial:    11.70 cm/s LV PW:          0.90 cm   LV E/e' medial:  8.2 LV IVS:        0.90 cm   LV e' lateral:   14.80 cm/s LVOT diam:     1.90 cm   LV E/e' lateral: 6.5 LV SV:         80 LV SV Index:   42 LVOT Area:     2.84 cm  RIGHT VENTRICLE             IVC RV S prime:     15.80 cm/s  IVC diam: 1.90 cm TAPSE (M-mode): 2.5 cm LEFT ATRIUM             Index        RIGHT ATRIUM           Index LA diam:        3.70 cm 1.93 cm/m   RA Area:     11.80 cm LA Vol (A2C):   55.8 ml 29.15 ml/m  RA Volume:   24.10 ml  12.59 ml/m LA Vol (A4C):   36.8 ml 19.22 ml/m LA Biplane Vol: 45.4 ml 23.72 ml/m  AORTIC VALVE LVOT Vmax:   129.00 cm/s LVOT Vmean:  83.900 cm/s LVOT VTI:    0.283 m  AORTA Ao Root diam: 2.70 cm Ao Asc diam:  2.70 cm MITRAL VALVE               TRICUSPID VALVE MV Area (PHT): 4.21 cm    TR Peak grad:   21.5 mmHg MV Decel Time: 180 msec    TR Vmax:        232.00 cm/s MV E velocity: 96.20 cm/s MV A velocity: 92.50 cm/s  SHUNTS MV E/A ratio:  1.04        Systemic VTI:  0.28 m                            Systemic Diam: 1.90 cm Rachelle Hora Croitoru MD Electronically signed by Thurmon Fair MD Signature Date/Time: 03/15/2024/2:58:14 PM    Final    MR BRAIN WO CONTRAST Result Date: 03/15/2024 CLINICAL DATA:  Neuro deficit, acute, stroke suspected EXAM: MRI HEAD WITHOUT CONTRAST TECHNIQUE: Multiplanar, multiecho pulse sequences of the brain and surrounding structures were obtained without intravenous contrast. COMPARISON:  CTA head/neck March 14, 2024. FINDINGS: Brain: No acute infarction, hemorrhage, hydrocephalus, extra-axial collection or mass lesion. Vascular: Normal flow voids. Skull and upper cervical spine: Normal marrow signal. Sinuses/Orbits: Negative. Other: No mastoid effusions. IMPRESSION: Normal brain MRI. No acute abnormality. Electronically Signed   By: Feliberto Harts M.D.   On: 03/15/2024 02:49   CT ANGIO HEAD NECK W WO CM Result Date: 03/14/2024 CLINICAL DATA:  Neuro deficit, acute, stroke suspected; Dural venous sinus thrombosis  suspected EXAM: CT ANGIOGRAPHY HEAD AND NECK WITH AND WITHOUT CONTRAST CT VENOGRAM TECHNIQUE: Multidetector CT imaging of the head and neck was performed using the standard protocol during bolus administration of intravenous contrast. Multiplanar CT image reconstructions  and MIPs were obtained to evaluate the vascular anatomy. Carotid stenosis measurements (when applicable) are obtained utilizing NASCET criteria, using the distal internal carotid diameter as the denominator. Venographic phase images of the brain were obtained following the administration of intravenous contrast. Multiplanar reformats and maximum intensity projections were generated. RADIATION DOSE REDUCTION: This exam was performed according to the departmental dose-optimization program which includes automated exposure control, adjustment of the mA and/or kV according to patient size and/or use of iterative reconstruction technique. CONTRAST:  75mL OMNIPAQUE IOHEXOL 350 MG/ML SOLN COMPARISON:  None Available. FINDINGS: CT HEAD FINDINGS Brain: No evidence of acute infarction, hemorrhage, hydrocephalus, extra-axial collection or mass lesion/mass effect. Vascular: See below. Skull: No acute fracture. Sinuses/Orbits: Clear sinuses.  No acute orbital findings. Other: No mastoid effusions. Review of the MIP images confirms the above findings CTA NECK FINDINGS Aortic arch: Great vessel origins are patent without significant stenosis. Right carotid system: No evidence of dissection, stenosis (50% or greater), or occlusion. Left carotid system: No evidence of dissection, stenosis (50% or greater), or occlusion. Vertebral arteries: Right-dominant. No evidence of dissection, stenosis (50% or greater), or occlusion. Skeleton: No evidence of acute abnormality on limited assessment. Other neck: No evidence of acute abnormality on limited assessment. Upper chest: Visualized lung apices are clear. Review of the MIP images confirms the above findings CTA HEAD  FINDINGS Anterior circulation: Bilateral intracranial ICAs, both MCAs, and ACAs are patent without proximal hemodynamically significant stenosis. Posterior circulation: Bilateral intradural vertebral arteries, basilar artery and bilateral posterior cerebral arteries are patent without proximal hemodynamically significant stenosis. Venous sinuses: See below. Review of the MIP images confirms the above findings CT VENOGRAM No evidence of dural venous sinus thrombosis. Specifically, the superior sagittal, straight, sigmoid, and transverse sinuses are patent. Visualized deep cerebral veins are patent. Symmetric cavernous sinus opacification. IMPRESSION: 1. No evidence of acute intracranial abnormality. 2. No large vessel occlusion or proximal hemodynamically significant stenosis. 3. No evidence of dural venous sinus thrombosis. Electronically Signed   By: Feliberto Harts M.D.   On: 03/14/2024 20:10   CT VENOGRAM HEAD Result Date: 03/14/2024 CLINICAL DATA:  Neuro deficit, acute, stroke suspected; Dural venous sinus thrombosis suspected EXAM: CT ANGIOGRAPHY HEAD AND NECK WITH AND WITHOUT CONTRAST CT VENOGRAM TECHNIQUE: Multidetector CT imaging of the head and neck was performed using the standard protocol during bolus administration of intravenous contrast. Multiplanar CT image reconstructions and MIPs were obtained to evaluate the vascular anatomy. Carotid stenosis measurements (when applicable) are obtained utilizing NASCET criteria, using the distal internal carotid diameter as the denominator. Venographic phase images of the brain were obtained following the administration of intravenous contrast. Multiplanar reformats and maximum intensity projections were generated. RADIATION DOSE REDUCTION: This exam was performed according to the departmental dose-optimization program which includes automated exposure control, adjustment of the mA and/or kV according to patient size and/or use of iterative reconstruction  technique. CONTRAST:  75mL OMNIPAQUE IOHEXOL 350 MG/ML SOLN COMPARISON:  None Available. FINDINGS: CT HEAD FINDINGS Brain: No evidence of acute infarction, hemorrhage, hydrocephalus, extra-axial collection or mass lesion/mass effect. Vascular: See below. Skull: No acute fracture. Sinuses/Orbits: Clear sinuses.  No acute orbital findings. Other: No mastoid effusions. Review of the MIP images confirms the above findings CTA NECK FINDINGS Aortic arch: Great vessel origins are patent without significant stenosis. Right carotid system: No evidence of dissection, stenosis (50% or greater), or occlusion. Left carotid system: No evidence of dissection, stenosis (50% or greater), or occlusion. Vertebral arteries: Right-dominant. No evidence of dissection, stenosis (  50% or greater), or occlusion. Skeleton: No evidence of acute abnormality on limited assessment. Other neck: No evidence of acute abnormality on limited assessment. Upper chest: Visualized lung apices are clear. Review of the MIP images confirms the above findings CTA HEAD FINDINGS Anterior circulation: Bilateral intracranial ICAs, both MCAs, and ACAs are patent without proximal hemodynamically significant stenosis. Posterior circulation: Bilateral intradural vertebral arteries, basilar artery and bilateral posterior cerebral arteries are patent without proximal hemodynamically significant stenosis. Venous sinuses: See below. Review of the MIP images confirms the above findings CT VENOGRAM No evidence of dural venous sinus thrombosis. Specifically, the superior sagittal, straight, sigmoid, and transverse sinuses are patent. Visualized deep cerebral veins are patent. Symmetric cavernous sinus opacification. IMPRESSION: 1. No evidence of acute intracranial abnormality. 2. No large vessel occlusion or proximal hemodynamically significant stenosis. 3. No evidence of dural venous sinus thrombosis. Electronically Signed   By: Feliberto Harts M.D.   On: 03/14/2024  20:10    Microbiology: No results found for this or any previous visit (from the past 240 hours).   Labs: Basic Metabolic Panel: Recent Labs  Lab 03/14/24 1802  NA 136  K 3.6  CL 103  CO2 24  GLUCOSE 102*  BUN 15  CREATININE 0.76  CALCIUM 8.9   Liver Function Tests: No results for input(s): "AST", "ALT", "ALKPHOS", "BILITOT", "PROT", "ALBUMIN" in the last 168 hours. No results for input(s): "LIPASE", "AMYLASE" in the last 168 hours. No results for input(s): "AMMONIA" in the last 168 hours. CBC: Recent Labs  Lab 03/14/24 1802  WBC 8.0  NEUTROABS 4.8  HGB 13.0  HCT 37.5  MCV 92.6  PLT 275   Cardiac Enzymes: No results for input(s): "CKTOTAL", "CKMB", "CKMBINDEX", "TROPONINI" in the last 168 hours. BNP: BNP (last 3 results) No results for input(s): "BNP" in the last 8760 hours.  ProBNP (last 3 results) No results for input(s): "PROBNP" in the last 8760 hours.  CBG: No results for input(s): "GLUCAP" in the last 168 hours.     Signed:  Ramiro Harvest MD.  [facility] 03/15/2024, 4:09 PM

## 2024-03-15 NOTE — Progress Notes (Signed)
Patient off the unit to vascular.

## 2024-03-15 NOTE — Progress Notes (Signed)
Patient back to the unit.

## 2024-03-15 NOTE — Progress Notes (Signed)
  Echocardiogram 2D Echocardiogram has been performed.  Leda Roys RDCS 03/15/2024, 2:34 PM

## 2024-03-15 NOTE — Progress Notes (Signed)
 Patient discharged, AVS instructions given, patient verbalized understanding, dropped to main entrance A to her ride.

## 2024-03-15 NOTE — Progress Notes (Addendum)
 STROKE TEAM PROGRESS NOTE    INTERIM HISTORY/SUBJECTIVE  Family at bedside.  Patient confirms that blurry vision, decreased vision in left eye lasted around 15 to 20 minutes yesterday and was accompanied by a mild headache.  She has a history of migraines around 20 years ago.  States she has not had any migraines since then. No neurological deficits seen on exam.   OBJECTIVE  CBC    Component Value Date/Time   WBC 8.0 03/14/2024 1802   RBC 4.05 03/14/2024 1802   HGB 13.0 03/14/2024 1802   HGB 11.8 (L) 07/26/2023 1424   HCT 37.5 03/14/2024 1802   PLT 275 03/14/2024 1802   PLT 324 07/26/2023 1424   MCV 92.6 03/14/2024 1802   MCH 32.1 03/14/2024 1802   MCHC 34.7 03/14/2024 1802   RDW 13.6 03/14/2024 1802   LYMPHSABS 2.2 03/14/2024 1802   MONOABS 0.8 03/14/2024 1802   EOSABS 0.1 03/14/2024 1802   BASOSABS 0.1 03/14/2024 1802    BMET    Component Value Date/Time   NA 136 03/14/2024 1802   NA 136 (A) 05/08/2021 0000   K 3.6 03/14/2024 1802   CL 103 03/14/2024 1802   CO2 24 03/14/2024 1802   GLUCOSE 102 (H) 03/14/2024 1802   BUN 15 03/14/2024 1802   BUN 16 05/08/2021 0000   CREATININE 0.76 03/14/2024 1802   CREATININE 0.84 07/26/2023 1424   CALCIUM 8.9 03/14/2024 1802   GFRNONAA >60 03/14/2024 1802   GFRNONAA >60 07/26/2023 1424    IMAGING past 24 hours MR BRAIN WO CONTRAST Result Date: 03/15/2024 CLINICAL DATA:  Neuro deficit, acute, stroke suspected EXAM: MRI HEAD WITHOUT CONTRAST TECHNIQUE: Multiplanar, multiecho pulse sequences of the brain and surrounding structures were obtained without intravenous contrast. COMPARISON:  CTA head/neck March 14, 2024. FINDINGS: Brain: No acute infarction, hemorrhage, hydrocephalus, extra-axial collection or mass lesion. Vascular: Normal flow voids. Skull and upper cervical spine: Normal marrow signal. Sinuses/Orbits: Negative. Other: No mastoid effusions. IMPRESSION: Normal brain MRI. No acute abnormality. Electronically Signed   By:  Feliberto Harts M.D.   On: 03/15/2024 02:49   CT ANGIO HEAD NECK W WO CM Result Date: 03/14/2024 CLINICAL DATA:  Neuro deficit, acute, stroke suspected; Dural venous sinus thrombosis suspected EXAM: CT ANGIOGRAPHY HEAD AND NECK WITH AND WITHOUT CONTRAST CT VENOGRAM TECHNIQUE: Multidetector CT imaging of the head and neck was performed using the standard protocol during bolus administration of intravenous contrast. Multiplanar CT image reconstructions and MIPs were obtained to evaluate the vascular anatomy. Carotid stenosis measurements (when applicable) are obtained utilizing NASCET criteria, using the distal internal carotid diameter as the denominator. Venographic phase images of the brain were obtained following the administration of intravenous contrast. Multiplanar reformats and maximum intensity projections were generated. RADIATION DOSE REDUCTION: This exam was performed according to the departmental dose-optimization program which includes automated exposure control, adjustment of the mA and/or kV according to patient size and/or use of iterative reconstruction technique. CONTRAST:  75mL OMNIPAQUE IOHEXOL 350 MG/ML SOLN COMPARISON:  None Available. FINDINGS: CT HEAD FINDINGS Brain: No evidence of acute infarction, hemorrhage, hydrocephalus, extra-axial collection or mass lesion/mass effect. Vascular: See below. Skull: No acute fracture. Sinuses/Orbits: Clear sinuses.  No acute orbital findings. Other: No mastoid effusions. Review of the MIP images confirms the above findings CTA NECK FINDINGS Aortic arch: Great vessel origins are patent without significant stenosis. Right carotid system: No evidence of dissection, stenosis (50% or greater), or occlusion. Left carotid system: No evidence of dissection, stenosis (50% or greater),  or occlusion. Vertebral arteries: Right-dominant. No evidence of dissection, stenosis (50% or greater), or occlusion. Skeleton: No evidence of acute abnormality on limited  assessment. Other neck: No evidence of acute abnormality on limited assessment. Upper chest: Visualized lung apices are clear. Review of the MIP images confirms the above findings CTA HEAD FINDINGS Anterior circulation: Bilateral intracranial ICAs, both MCAs, and ACAs are patent without proximal hemodynamically significant stenosis. Posterior circulation: Bilateral intradural vertebral arteries, basilar artery and bilateral posterior cerebral arteries are patent without proximal hemodynamically significant stenosis. Venous sinuses: See below. Review of the MIP images confirms the above findings CT VENOGRAM No evidence of dural venous sinus thrombosis. Specifically, the superior sagittal, straight, sigmoid, and transverse sinuses are patent. Visualized deep cerebral veins are patent. Symmetric cavernous sinus opacification. IMPRESSION: 1. No evidence of acute intracranial abnormality. 2. No large vessel occlusion or proximal hemodynamically significant stenosis. 3. No evidence of dural venous sinus thrombosis. Electronically Signed   By: Feliberto Harts M.D.   On: 03/14/2024 20:10   CT VENOGRAM HEAD Result Date: 03/14/2024 CLINICAL DATA:  Neuro deficit, acute, stroke suspected; Dural venous sinus thrombosis suspected EXAM: CT ANGIOGRAPHY HEAD AND NECK WITH AND WITHOUT CONTRAST CT VENOGRAM TECHNIQUE: Multidetector CT imaging of the head and neck was performed using the standard protocol during bolus administration of intravenous contrast. Multiplanar CT image reconstructions and MIPs were obtained to evaluate the vascular anatomy. Carotid stenosis measurements (when applicable) are obtained utilizing NASCET criteria, using the distal internal carotid diameter as the denominator. Venographic phase images of the brain were obtained following the administration of intravenous contrast. Multiplanar reformats and maximum intensity projections were generated. RADIATION DOSE REDUCTION: This exam was performed according  to the departmental dose-optimization program which includes automated exposure control, adjustment of the mA and/or kV according to patient size and/or use of iterative reconstruction technique. CONTRAST:  75mL OMNIPAQUE IOHEXOL 350 MG/ML SOLN COMPARISON:  None Available. FINDINGS: CT HEAD FINDINGS Brain: No evidence of acute infarction, hemorrhage, hydrocephalus, extra-axial collection or mass lesion/mass effect. Vascular: See below. Skull: No acute fracture. Sinuses/Orbits: Clear sinuses.  No acute orbital findings. Other: No mastoid effusions. Review of the MIP images confirms the above findings CTA NECK FINDINGS Aortic arch: Great vessel origins are patent without significant stenosis. Right carotid system: No evidence of dissection, stenosis (50% or greater), or occlusion. Left carotid system: No evidence of dissection, stenosis (50% or greater), or occlusion. Vertebral arteries: Right-dominant. No evidence of dissection, stenosis (50% or greater), or occlusion. Skeleton: No evidence of acute abnormality on limited assessment. Other neck: No evidence of acute abnormality on limited assessment. Upper chest: Visualized lung apices are clear. Review of the MIP images confirms the above findings CTA HEAD FINDINGS Anterior circulation: Bilateral intracranial ICAs, both MCAs, and ACAs are patent without proximal hemodynamically significant stenosis. Posterior circulation: Bilateral intradural vertebral arteries, basilar artery and bilateral posterior cerebral arteries are patent without proximal hemodynamically significant stenosis. Venous sinuses: See below. Review of the MIP images confirms the above findings CT VENOGRAM No evidence of dural venous sinus thrombosis. Specifically, the superior sagittal, straight, sigmoid, and transverse sinuses are patent. Visualized deep cerebral veins are patent. Symmetric cavernous sinus opacification. IMPRESSION: 1. No evidence of acute intracranial abnormality. 2. No large  vessel occlusion or proximal hemodynamically significant stenosis. 3. No evidence of dural venous sinus thrombosis. Electronically Signed   By: Feliberto Harts M.D.   On: 03/14/2024 20:10    Vitals:   03/14/24 2304 03/14/24 2354 03/15/24 0307 03/15/24 0748  BP:  110/65 (!) 111/59 103/65 96/60  Pulse: 77 78 (!) 59 63  Resp: 18 18 18 16   Temp: 98.3 F (36.8 C) 98.6 F (37 C) 97.6 F (36.4 C) 98.6 F (37 C)  TempSrc:  Oral Oral Oral  SpO2: 99% 100% 99% 98%  Weight:      Height:       PHYSICAL EXAM General:  Alert, well-nourished, well-developed patient in no acute distress Psych:  Mood and affect appropriate for situation CV: Regular rate and rhythm on monitor Respiratory:  Regular, unlabored respirations on room air GI: Abdomen soft and nontender  NEURO:  Mental Status: AA&Ox3, patient is able to give clear and coherent history. Poor attention and concentration.  Speech/Language: speech is without dysarthria or aphasia.  Naming, repetition, fluency, and comprehension intact.  Cranial Nerves:  II: PERRL. Visual fields full.  III, IV, VI: EOMI. Eyelids elevate symmetrically.  V: Sensation is intact to light touch and symmetrical to face.  VII: Face is symmetrical resting and smiling VIII: hearing intact to voice. IX, X: Palate elevates symmetrically. Phonation is normal.  WU:[id number] shrug 5/5. XII: tongue is midline without fasciculations. Motor: 5/5 strength to all muscle groups tested.  Tone: is normal and bulk is normal Sensation- Intact to light touch bilaterally. Extinction absent to light touch to DSS.   Coordination: FTN intact bilaterally, HKS: no ataxia in BLE.No drift.  Gait- deferred  Most Recent NIH: 0   ASSESSMENT/PLAN  [name] is a 48 y.o. female with hx of hemochromatosis, SLE, OSA, who presents with episode of vision loss in L eye lasting about 15 mins with spontaneous resolution.MRI negative. Admitted for TIA workup.  NIH on Admission:  0  Most likely complicated migraine with visual aura, less likely amaurosis fugax or TIA Pt stated that she had episode of mild HA with left eye blurry vision due to grey shape in the center field accompanied with bottom halo and flushing, lasted about . And HA resolved about .  Hx of migraine x 2 in college. No recent migraine episode. CT head CTH was negative for acute finding CTA head & neck: No LVO MRI: No acute abnormalities.  2D Echo: LVEF 60 to 65% VAS [place] LE DVT: Negative TCD w/ Bubble Study: Negative for significant PFO Carotid ultrasound 2023: No hemodynamically significant stenosis Hypercoagulable Panel: Homocysteine, ANA Cardiolipin, Beta-2, Lupus anticoagulant PENDING. Will need to follow-up with PCP for results, patient aware.  LDL 75 HgbA1c 5.0 UDS negative VTE prophylaxis - loenox No antithrombotic prior to admission, continue aspirin 81 mg daily and clopidogrel 75 mg daily for 3 weeks and then aspirin alone. Therapy recommendations:  No PT/OT notes, Patient ambulating in room independently Disposition:  home today  Lupus Follow-up with rheumatologist Dr. Sharmon Revere at [facility] Currently on Plaquenil CRP, ESR WNL, ANA pending Previous tests with C3, C4, dsDNA negative twice Continue follow-up with rheumatology as outpatient  Lipid management Home meds:  none LDL 75, goal < 100 Continue diet control   Other Stroke Risk Factors Obesity, Body mass index is 32.61 kg/m., BMI >/= 30 associated with increased stroke risk, recommend weight loss, diet and exercise as appropriate  Family hx stroke (Father (TIA), Maternal Grandmother (TIA), Paternal Grandfather) Obstructive sleep apnea, on CPAP at home Former smoker  Other Active Problems History of Fibromyalgia History of hemochromatosis  Hospital day # 0  Patient is OK for discharge from neurology standpoint, with recommendations as above. Follow-up with outpatient neurology in 8 weeks.  Pt  seen by Neuro NP/APP and later by MD. Note/plan to be edited by MD as needed.    Lynnae January, DNP, AGACNP-BC [facility] Please use AMION for contact information & EPIC for messaging.  ATTENDING NOTE: I reviewed above note and agree with the assessment and plan. Pt was seen and examined.   Husband at bedside.  Patient lying in bed, neuro intact, currently at baseline.  Stated that she had acute onset left eye blurry vision with gray shade in the middle of visual field accompanied by halo and bottom with flashing, lasted 15 minutes and resolved.  At meantime patient had mild right-sided headache, lasted about 30 minutes and resolved.  Patient does have history of migraine when she was in college but no headache recently.  Stroke workup negative so far.  She does have history of lupus, had LE venous Doppler showed no DVT.  TCD bubble study insignificance for PFO.  Patient symptoms likely complicated migraine with visual aura.  Although amaurosis fugax and TIA are in the differential, however less likely.  Okay to continue DAPT for 3 weeks and then aspirin alone given history of lupus.  No statin needed given LDL at goal.  PT and OT no recommendation.  Continue follow-up with rheumatology.  Follow-up with [facility] as outpatient.  For detailed assessment and plan, please refer to above/below as I have made changes wherever appropriate.   Neurology will sign off. Please call with questions. Pt will follow up with stroke clinic NP at [facility] in about 4-8 weeks. Thanks for the consult.   Marvel Plan, MD PhD Stroke Neurology 03/15/2024 4:49 PM     To contact Stroke Continuity provider, please refer to [email or web address]. After hours, contact General Neurology

## 2024-03-15 NOTE — Progress Notes (Signed)
 Pt transported back to ward, check in completed, vital signs taken

## 2024-03-15 NOTE — Consult Note (Signed)
 NEUROLOGY CONSULT NOTE   Date of service: March 15, 2024 Patient Name: Wendy Spencer MRN:  [id number] DOB:  [birth year] Chief Complaint: "Vision deficit" Requesting Provider: Tereasa Coop, MD  History of Present Illness  Wendy Spencer is a 48 y.o. female with hx of hemochromatosis, SLE, OSA, who presents with episode of vision loss in L eye lasting about 15 mins with spontaneous resolution.  Reports she had just gotten done cooking dinner and then was sitting with her son and her significant other when she developed a large area of absent vision/blurriness on the left side.  Her significant other was seated on the left side and she would only see his arm but had trouble looking at his face or his body.  She looked at her phone and then at the TV and noted that she had trouble on the left side of her vision.  She is not sure if this was left half of her vision or if it was left eye only that was affected.  This went on for about 15 to 20 minutes and so she decided to come to the ED to get this checked out.  This resolved as soon as she was about to get ready to come to ED.  She denies any prior similar episodes.  She endorses that she had some slight headache on the right side around the time of this episode 2.  She denies any history of migraines.  She denies any history of visual auras.  Her last headache was 17 years ago.  She denies any history of diabetes, hypertension, hyperlipidemia.  She endorses that her father and her grandfather both had strokes/TIA.  She does not smoke, does not use any recreational drugs, she only socially drinks alcohol.  LKW: 1630 on 03/14/2024 Modified rankin score: 0-Completely asymptomatic and back to baseline post- stroke IV Thrombolysis: Not offered, due to spontaneous resolution.   EVT: Not offered, no LVO and spontaneous resolution of vision deficit.    NIHSS components Score: Comment  1a Level of Conscious 0[x]  1[]  2[]  3[]      1b LOC  Questions 0[x]  1[]  2[]       1c LOC Commands 0[x]  1[]  2[]       2 Best Gaze 0[x]  1[]  2[]       3 Visual 0[x]  1[]  2[]  3[]      4 Facial Palsy 0[x]  1[]  2[]  3[]      5a Motor Arm - left 0[x]  1[]  2[]  3[]  4[]  UN[]    5b Motor Arm - Right 0[x]  1[]  2[]  3[]  4[]  UN[]    6a Motor Leg - Left 0[x]  1[]  2[]  3[]  4[]  UN[]    6b Motor Leg - Right 0[x]  1[]  2[]  3[]  4[]  UN[]    7 Limb Ataxia 0[x]  1[]  2[]  3[]  UN[]     8 Sensory 0[x]  1[]  2[]  UN[]      9 Best Language 0[x]  1[]  2[]  3[]      10 Dysarthria 0[x]  1[]  2[]  UN[]      11 Extinct. and Inattention 0[x]  1[]  2[]       TOTAL: 0      ROS  Comprehensive ROS performed and pertinent positives documented in HPI   Past History   Past Medical History:  Diagnosis Date   Achilles tendinitis of left lower extremity 04/22/2017   Allergic dermatitis 08/05/2021   Last Assessment & Plan: Formatting of this note might be different from the original. Symptoms consistent with allergic dermatitis Take steriods as prescribed Stay out of the sun/heat  worsening of current symptoms, present  to the ER for further evaluation and treatment. Patient verbalized understanding and agreed with this plan of care.   Anemia    Anxiety    Depression    Fibromyalgia    GERD (gastroesophageal reflux disease)    Hereditary hemochromatosis (HCC)    Insomnia    Lupus    Obstructive sleep apnea on CPAP 07/18/2016   Rosacea    Systemic lupus erythematosus (HCC)    Vitamin D deficiency     Past Surgical History:  Procedure Laterality Date   CHOLECYSTECTOMY  2008   DILATION AND CURETTAGE OF UTERUS  1994   LAPAROSCOPIC GASTRIC SLEEVE RESECTION  2017    Family History: Family History  Problem Relation Age of Onset   Hypertension Mother    Heart murmur Mother    Celiac disease Mother    Skin cancer Mother    CAD Mother    Hypertension Father    Heart attack Father    Transient ischemic attack Father    Thyroid disease Father    Heart murmur Father    Thyroid disease Sister     Hypertension Brother    Transient ischemic attack Maternal Grandmother    Heart disease Maternal Grandfather        Enlarged heart   CVA Paternal Grandmother    Hypertension Paternal Grandmother    Heart attack Paternal Grandfather    Hypertension Paternal Grandfather     Social History  reports that she quit smoking about 25 years ago. Her smoking use included cigarettes. She does not have any smokeless tobacco history on file. She reports current alcohol use. She reports that she does not use drugs.  Allergies  Allergen Reactions   Fluogen [Influenza Virus Vaccine] Shortness Of Breath and Rash   Haemophilus B Polysaccharide Vaccine Rash and Shortness Of Breath   Macrobid [Nitrofurantoin] Shortness Of Breath   Phenazopyridine Hcl Shortness Of Breath   Pyridium [Phenazopyridine] Shortness Of Breath   Latex Dermatitis    Other reaction(s): Contact Dermatitis (intolerance)   Naltrexone Hives   Avocado Other (See Comments)   Banana    Duloxetine Hcl Other (See Comments)    Headaches   Grape (Artificial) Flavoring Agent (Non-Screening)     Grape fruit    Kiwi Extract Other (See Comments)   Pylera [Bis Subcit-Metronid-Tetracyc] Itching    Medications  No current facility-administered medications for this encounter.  Vitals   Vitals:   03/14/24 1942 03/14/24 2155 03/14/24 2304 03/14/24 2354  BP:  120/67 110/65 (!) 111/59  Pulse:  63 77 78  Resp:  18 18 18   Temp:  98.4 F (36.9 C) 98.3 F (36.8 C) 98.6 F (37 C)  TempSrc:    Oral  SpO2: 99% 100% 99% 100%  Weight:      Height:        Body mass index is 32.61 kg/m.  Physical Exam   General: Laying comfortably in bed; in no acute distress.  HENT: Normal oropharynx and mucosa. Normal external appearance of ears and nose.  Neck: Supple, no pain or tenderness  CV: No JVD. No peripheral edema.  Pulmonary: Symmetric Chest rise. Normal respiratory effort.  Abdomen: Soft to touch, non-tender.  Ext: No cyanosis, edema,  or deformity  Skin: No rash. Normal palpation of skin.   Musculoskeletal: Normal digits and nails by inspection. No clubbing.   Neurologic Examination  Mental status/Cognition: Alert, oriented to self, place, month and year, good attention.  Speech/language: Fluent, comprehension intact, object naming intact, repetition  intact.  Cranial nerves:   CN II Pupils equal and reactive to light, no VF deficits    CN III,IV,VI EOM intact, no gaze preference or deviation, no nystagmus    CN V normal sensation in V1, V2, and V3 segments bilaterally    CN VII no asymmetry, no nasolabial fold flattening    CN VIII normal hearing to speech    CN IX & X normal palatal elevation, no uvular deviation    CN XI 5/5 head turn and 5/5 shoulder shrug bilaterally    CN XII midline tongue protrusion    Motor:  Muscle bulk: normal, tone normal, pronator drift none tremor none Mvmt Root Nerve  Muscle Right Left Comments  SA C5/6 Ax Deltoid 5 5   EF C5/6 Mc Biceps 5 5   EE C6/7/8 Rad Triceps 5 5   WF C6/7 Med FCR     WE C7/8 PIN ECU     F Ab C8/T1 U ADM/FDI 5 5   HF L1/2/3 Fem Illopsoas 5 5   KE L2/3/4 Fem Quad 5 5   DF L4/5 D Peron Tib Ant 5 5   PF S1/2 Tibial Grc/Sol 5 5    Sensation:  Light touch Intact throughout   Pin prick    Temperature    Vibration   Proprioception    Coordination/Complex Motor:  - Finger to Nose intact bilaterally - Heel to shin intact bilaterally - Rapid alternating movement intact bilaterally - Gait: Deferred  Labs/Imaging/Neurodiagnostic studies   CBC:  Recent Labs  Lab 2024-04-01 1802  WBC 8.0  NEUTROABS 4.8  HGB 13.0  HCT 37.5  MCV 92.6  PLT 275   Basic Metabolic Panel:  Lab Results  Component Value Date   NA 136 04/01/24   K 3.6 04/01/24   CO2 24 01-Apr-2024   GLUCOSE 102 (H) Apr 01, 2024   BUN 15 01-Apr-2024   CREATININE 0.76 01-Apr-2024   CALCIUM 8.9 Apr 01, 2024   GFRNONAA >60 04/01/2024   Lipid Panel: No results found for: "LDLCALC" HgbA1c: No  results found for: "HGBA1C" Urine Drug Screen: No results found for: "LABOPIA", "COCAINSCRNUR", "LABBENZ", "AMPHETMU", "THCU", "LABBARB"  Alcohol Level No results found for: "ETH" INR No results found for: "INR" APTT No results found for: "APTT" AED levels: No results found for: "PHENYTOIN", "ZONISAMIDE", "LAMOTRIGINE", "LEVETIRACETA"  CT Head without contrast(Personally reviewed): CTH was negative for a large hypodensity concerning for a large territory infarct or hyperdensity concerning for an ICH  CT angio Head and Neck with contrast(Personally reviewed): No LVO  MRI Brain(Personally reviewed): No acute abnormalities.  ASSESSMENT   Linet Brash is a 48 y.o. female with hx of hemochromatosis, SLE, OSA, who presents with episode of vision loss in L eye lasting about 15 mins with spontaneous resolution.  Suspect episode seems more consistent with a TIA or possibly amaurosis fugax.  He is unable to clarify if her symptoms were monocular or homonymous hemianopsia.  She denies any history of prior similar symptoms with headaches.  Last headache was 17 years ago.  Her history of hemochromatosis, SLE and OSA do put her at elevated risk of stroke/TIA.  I discussed this with patient and we will treat her as a possible TIA/amaurosis fugax.  RECOMMENDATIONS  - Frequent Neuro checks per stroke unit protocol - Recommend obtaining TTE  - Recommend obtaining Lipid panel with LDL - Please start statin if LDL > 70 - Recommend HbA1c to evaluate for diabetes and how well it is controlled. - Antithrombotic -aspirin  81 mg daily along with Plavix 75 mg daily for 21 days, followed by aspirin 81 mg daily alone. - Recommend DVT ppx - SBP goal - permissive hypertension first 24 h < 220/110. Held home meds.  - Recommend Telemetry monitoring for arrythmia - Recommend bedside swallow screen prior to PO intake. - Stroke education booklet - Recommend PT/OT/SLP  consult  ______________________________________________________________________    Signed, Erick Blinks, MD [facility]

## 2024-03-15 NOTE — Plan of Care (Signed)

## 2024-03-15 NOTE — H&P (Signed)
 History and Physical    Wendy Spencer NGE:[id number] DOB: 20-Mar-1976 DOA: 03/14/2024  PCP: Philemon Kingdom, MD  Patient coming from: [facility] ED  Chief Complaint: Transient left eye vision loss  HPI: Wendy Spencer is a 48 y.o. female with medical history significant of SLE, hemochromatosis, hyperlipidemia, anxiety/depression, GERD, OSA on CPAP presenting with a chief complaint of transient left eye vision loss.  Patient states yesterday around 4 or 5 PM she was watching TV when all of a sudden the vision in her left eye became blurry.  She was seeing a large blurry spot and was not able to see the actors on TV and when she looked down at her phone she was only able to see 1/3 of the screen.  A few minutes later she started having right-sided frontal headache.  Her vision went back to normal in about 15 to 20 minutes.  She was not having any difficulty with speech or weakness/paresthesias of arm or leg.  She was still having a headache when she arrived to the ED but it resolved after she received Tylenol.  She is starting to get a mild headache again and requesting additional Tylenol.  She takes hydrochloroquine for lupus and gets regular eye checkups from an ophthalmologist.  She has no other complaints.  Denies fevers, cough, shortness of breath, chest pain, nausea, vomiting, abdominal pain, or diarrhea.  ED Course: Vital signs stable on arrival.  No significant lab abnormalities on CBC and BMP.  CTA head and neck negative for acute findings.  CT venogram head negative for dural venous sinus thrombosis. ED physician discussed the case with neurology (Dr. Derry Lory) - recommended admission for TIA workup.  Case was also discussed with ophthalmology (Dr. Sherrine Maples) who felt that TIA workup was appropriate.  Patient was given Tylenol.  Review of Systems:  Review of Systems  All other systems reviewed and are negative.   Past Medical History:  Diagnosis Date   Achilles tendinitis of  left lower extremity 04/22/2017   Allergic dermatitis 08/05/2021   Last Assessment & Plan: Formatting of this note might be different from the original. Symptoms consistent with allergic dermatitis Take steriods as prescribed Stay out of the sun/heat  worsening of current symptoms, present to the ER for further evaluation and treatment. Patient verbalized understanding and agreed with this plan of care.   Anemia    Anxiety    Depression    Fibromyalgia    GERD (gastroesophageal reflux disease)    Hereditary hemochromatosis (HCC)    Insomnia    Lupus    Obstructive sleep apnea on CPAP 07/18/2016   Rosacea    Systemic lupus erythematosus (HCC)    Vitamin D deficiency     Past Surgical History:  Procedure Laterality Date   CHOLECYSTECTOMY  2008   DILATION AND CURETTAGE OF UTERUS  1994   LAPAROSCOPIC GASTRIC SLEEVE RESECTION  2017     reports that she quit smoking about 25 years ago. Her smoking use included cigarettes. She does not have any smokeless tobacco history on file. She reports current alcohol use. She reports that she does not use drugs.  Allergies  Allergen Reactions   Fluogen [Influenza Virus Vaccine] Shortness Of Breath and Rash   Haemophilus B Polysaccharide Vaccine Rash and Shortness Of Breath   Macrobid [Nitrofurantoin] Shortness Of Breath   Phenazopyridine Hcl Shortness Of Breath   Pyridium [Phenazopyridine] Shortness Of Breath   Latex Dermatitis    Other reaction(s): Contact Dermatitis (intolerance)   Naltrexone  Hives   Avocado Other (See Comments)   Banana    Duloxetine Hcl Other (See Comments)    Headaches   Grape (Artificial) Flavoring Agent (Non-Screening)     Grape fruit    Kiwi Extract Other (See Comments)   Pylera [Bis Subcit-Metronid-Tetracyc] Itching    Family History  Problem Relation Age of Onset   Hypertension Mother    Heart murmur Mother    Celiac disease Mother    Skin cancer Mother    CAD Mother    Hypertension Father    Heart  attack Father    Transient ischemic attack Father    Thyroid disease Father    Heart murmur Father    Thyroid disease Sister    Hypertension Brother    Transient ischemic attack Maternal Grandmother    Heart disease Maternal Grandfather        Enlarged heart   CVA Paternal Grandmother    Hypertension Paternal Grandmother    Heart attack Paternal Grandfather    Hypertension Paternal Grandfather     Prior to Admission medications   Medication Sig Start Date End Date Taking? Authorizing Provider  ALPRAZolam (XANAX) 0.25 MG tablet Take 0.25 mg by mouth 3 (three) times daily as needed for anxiety.    [provider]  FLUoxetine (PROZAC) 10 MG capsule Take 10 mg by mouth daily. 01/21/23   [provider]  hydroxychloroquine (PLAQUENIL) 200 MG tablet Take 400 mg by mouth daily. 12/20/15   [provider]  ondansetron (ZOFRAN-ODT) 4 MG disintegrating tablet Take 4 mg by mouth every 8 (eight) hours as needed for nausea or vomiting. 08/26/20   [provider]  pantoprazole (PROTONIX) 40 MG tablet Take 1 tablet by mouth daily. 03/09/21   [provider]  valACYclovir (VALTREX) 1000 MG tablet Take 2,000 mg by mouth every 12 (twelve) hours as needed (Fever blisters). 03/22/21   [provider]  Vitamin D, Ergocalciferol, (DRISDOL) 1.25 MG (50000 UNIT) CAPS capsule Take 50,000 Units by mouth once a week. 03/25/23   [provider]    Physical Exam: Vitals:   03/14/24 1942 03/14/24 2155 03/14/24 2304 03/14/24 2354  BP:  120/67 110/65 (!) 111/59  Pulse:  63 77 78  Resp:  18 18 18   Temp:  98.4 F (36.9 C) 98.3 F (36.8 C) 98.6 F (37 C)  TempSrc:    Oral  SpO2: 99% 100% 99% 100%  Weight:      Height:        Physical Exam Vitals reviewed.  Constitutional:      General: She is not in acute distress. HENT:     Head: Normocephalic and atraumatic.  Eyes:     Extraocular Movements: Extraocular movements intact.  Cardiovascular:     Rate  and Rhythm: Normal rate and regular rhythm.     Pulses: Normal pulses.  Pulmonary:     Effort: Pulmonary effort is normal. No respiratory distress.     Breath sounds: Normal breath sounds. No wheezing or rales.  Abdominal:     General: Bowel sounds are normal. There is no distension.     Palpations: Abdomen is soft.     Tenderness: There is no abdominal tenderness. There is no guarding.  Musculoskeletal:     Cervical back: Normal range of motion.     Right lower leg: No edema.     Left lower leg: No edema.  Skin:    General: Skin is warm and dry.  Neurological:  General: No focal deficit present.     Mental Status: She is alert and oriented to person, place, and time.     Cranial Nerves: No cranial nerve deficit.     Sensory: No sensory deficit.     Motor: No weakness.     Labs on Admission: I have personally reviewed following labs and imaging studies  CBC: Recent Labs  Lab 03/14/24 1802  WBC 8.0  NEUTROABS 4.8  HGB 13.0  HCT 37.5  MCV 92.6  PLT 275   Basic Metabolic Panel: Recent Labs  Lab 03/14/24 1802  NA 136  K 3.6  CL 103  CO2 24  GLUCOSE 102*  BUN 15  CREATININE 0.76  CALCIUM 8.9   GFR: Estimated Creatinine Clearance: 90.4 mL/min (by C-G formula based on SCr of 0.76 mg/dL). Liver Function Tests: No results for input(s): "AST", "ALT", "ALKPHOS", "BILITOT", "PROT", "ALBUMIN" in the last 168 hours. No results for input(s): "LIPASE", "AMYLASE" in the last 168 hours. No results for input(s): "AMMONIA" in the last 168 hours. Coagulation Profile: No results for input(s): "INR", "PROTIME" in the last 168 hours. Cardiac Enzymes: No results for input(s): "CKTOTAL", "CKMB", "CKMBINDEX", "TROPONINI" in the last 168 hours. BNP (last 3 results) No results for input(s): "PROBNP" in the last 8760 hours. HbA1C: No results for input(s): "HGBA1C" in the last 72 hours. CBG: No results for input(s): "GLUCAP" in the last 168 hours. Lipid Profile: No results for  input(s): "CHOL", "HDL", "LDLCALC", "TRIG", "CHOLHDL", "LDLDIRECT" in the last 72 hours. Thyroid Function Tests: No results for input(s): "TSH", "T4TOTAL", "FREET4", "T3FREE", "THYROIDAB" in the last 72 hours. Anemia Panel: No results for input(s): "VITAMINB12", "FOLATE", "FERRITIN", "TIBC", "IRON", "RETICCTPCT" in the last 72 hours. Urine analysis: No results found for: "COLORURINE", "APPEARANCEUR", "LABSPEC", "PHURINE", "GLUCOSEU", "HGBUR", "BILIRUBINUR", "KETONESUR", "PROTEINUR", "UROBILINOGEN", "NITRITE", "LEUKOCYTESUR"  Radiological Exams on Admission: CT ANGIO HEAD NECK W WO CM Result Date: 03/14/2024 CLINICAL DATA:  Neuro deficit, acute, stroke suspected; Dural venous sinus thrombosis suspected EXAM: CT ANGIOGRAPHY HEAD AND NECK WITH AND WITHOUT CONTRAST CT VENOGRAM TECHNIQUE: Multidetector CT imaging of the head and neck was performed using the standard protocol during bolus administration of intravenous contrast. Multiplanar CT image reconstructions and MIPs were obtained to evaluate the vascular anatomy. Carotid stenosis measurements (when applicable) are obtained utilizing NASCET criteria, using the distal internal carotid diameter as the denominator. Venographic phase images of the brain were obtained following the administration of intravenous contrast. Multiplanar reformats and maximum intensity projections were generated. RADIATION DOSE REDUCTION: This exam was performed according to the departmental dose-optimization program which includes automated exposure control, adjustment of the mA and/or kV according to patient size and/or use of iterative reconstruction technique. CONTRAST:  75mL OMNIPAQUE IOHEXOL 350 MG/ML SOLN COMPARISON:  None Available. FINDINGS: CT HEAD FINDINGS Brain: No evidence of acute infarction, hemorrhage, hydrocephalus, extra-axial collection or mass lesion/mass effect. Vascular: See below. Skull: No acute fracture. Sinuses/Orbits: Clear sinuses.  No acute orbital  findings. Other: No mastoid effusions. Review of the MIP images confirms the above findings CTA NECK FINDINGS Aortic arch: Great vessel origins are patent without significant stenosis. Right carotid system: No evidence of dissection, stenosis (50% or greater), or occlusion. Left carotid system: No evidence of dissection, stenosis (50% or greater), or occlusion. Vertebral arteries: Right-dominant. No evidence of dissection, stenosis (50% or greater), or occlusion. Skeleton: No evidence of acute abnormality on limited assessment. Other neck: No evidence of acute abnormality on limited assessment. Upper chest: Visualized lung apices are clear. Review  of the MIP images confirms the above findings CTA HEAD FINDINGS Anterior circulation: Bilateral intracranial ICAs, both MCAs, and ACAs are patent without proximal hemodynamically significant stenosis. Posterior circulation: Bilateral intradural vertebral arteries, basilar artery and bilateral posterior cerebral arteries are patent without proximal hemodynamically significant stenosis. Venous sinuses: See below. Review of the MIP images confirms the above findings CT VENOGRAM No evidence of dural venous sinus thrombosis. Specifically, the superior sagittal, straight, sigmoid, and transverse sinuses are patent. Visualized deep cerebral veins are patent. Symmetric cavernous sinus opacification. IMPRESSION: 1. No evidence of acute intracranial abnormality. 2. No large vessel occlusion or proximal hemodynamically significant stenosis. 3. No evidence of dural venous sinus thrombosis. Electronically Signed   By: Feliberto Harts M.D.   On: 03/14/2024 20:10   CT VENOGRAM HEAD Result Date: 03/14/2024 CLINICAL DATA:  Neuro deficit, acute, stroke suspected; Dural venous sinus thrombosis suspected EXAM: CT ANGIOGRAPHY HEAD AND NECK WITH AND WITHOUT CONTRAST CT VENOGRAM TECHNIQUE: Multidetector CT imaging of the head and neck was performed using the standard protocol during bolus  administration of intravenous contrast. Multiplanar CT image reconstructions and MIPs were obtained to evaluate the vascular anatomy. Carotid stenosis measurements (when applicable) are obtained utilizing NASCET criteria, using the distal internal carotid diameter as the denominator. Venographic phase images of the brain were obtained following the administration of intravenous contrast. Multiplanar reformats and maximum intensity projections were generated. RADIATION DOSE REDUCTION: This exam was performed according to the departmental dose-optimization program which includes automated exposure control, adjustment of the mA and/or kV according to patient size and/or use of iterative reconstruction technique. CONTRAST:  75mL OMNIPAQUE IOHEXOL 350 MG/ML SOLN COMPARISON:  None Available. FINDINGS: CT HEAD FINDINGS Brain: No evidence of acute infarction, hemorrhage, hydrocephalus, extra-axial collection or mass lesion/mass effect. Vascular: See below. Skull: No acute fracture. Sinuses/Orbits: Clear sinuses.  No acute orbital findings. Other: No mastoid effusions. Review of the MIP images confirms the above findings CTA NECK FINDINGS Aortic arch: Great vessel origins are patent without significant stenosis. Right carotid system: No evidence of dissection, stenosis (50% or greater), or occlusion. Left carotid system: No evidence of dissection, stenosis (50% or greater), or occlusion. Vertebral arteries: Right-dominant. No evidence of dissection, stenosis (50% or greater), or occlusion. Skeleton: No evidence of acute abnormality on limited assessment. Other neck: No evidence of acute abnormality on limited assessment. Upper chest: Visualized lung apices are clear. Review of the MIP images confirms the above findings CTA HEAD FINDINGS Anterior circulation: Bilateral intracranial ICAs, both MCAs, and ACAs are patent without proximal hemodynamically significant stenosis. Posterior circulation: Bilateral intradural vertebral  arteries, basilar artery and bilateral posterior cerebral arteries are patent without proximal hemodynamically significant stenosis. Venous sinuses: See below. Review of the MIP images confirms the above findings CT VENOGRAM No evidence of dural venous sinus thrombosis. Specifically, the superior sagittal, straight, sigmoid, and transverse sinuses are patent. Visualized deep cerebral veins are patent. Symmetric cavernous sinus opacification. IMPRESSION: 1. No evidence of acute intracranial abnormality. 2. No large vessel occlusion or proximal hemodynamically significant stenosis. 3. No evidence of dural venous sinus thrombosis. Electronically Signed   By: Feliberto Harts M.D.   On: 03/14/2024 20:10    Assessment and Plan  Headache and transient left eye vision loss CTA head and neck negative for acute findings.  CT venogram head negative for dural venous sinus thrombosis.  Brain MRI showing no acute abnormalities.  Neurology suspecting TIA versus possible amaurosis fugax. -Appreciate neurology recommendations -Continue aspirin 81 mg daily along with Plavix 75  mg daily for 21 days, followed by aspirin 81 mg daily alone -Permissive hypertension first 24 hours <220/110 -Telemetry monitoring -Echocardiogram -Hemoglobin A1c, fasting lipid panel -Frequent neurochecks -PT/OT/SLP consult  OSA Continue nightly CPAP.  Hemochromatosis Outpatient hematology follow-up.  SLE Hyperlipidemia Anxiety/depression GERD Continue home meds after pharmacy med rec is done.  DVT prophylaxis: Lovenox Code Status: Full Code (discussed with the patient) Family Communication: No family available at this time. Consults called: Neurology Level of care: Telemetry bed Admission status: It is my clinical opinion that referral for OBSERVATION is reasonable and necessary in this patient based on the above information provided. The aforementioned taken together are felt to place the patient at high risk for further  clinical deterioration. However, it is anticipated that the patient may be medically stable for discharge from the hospital within 24 to 48 hours.  John Giovanni MD [facility]  If 7PM-7AM, please contact night-coverage [email or web address]  03/15/2024, 1:49 AM

## 2024-03-15 NOTE — Progress Notes (Signed)
 VASCULAR LAB    TCD bubble study has been performed.  See CV proc for preliminary results.   Pilar Westergaard, RVT 03/15/2024, 2:55 PM

## 2024-03-15 NOTE — Progress Notes (Signed)
 VASCULAR LAB    Bilateral lower extremity venous duplex has been performed.  See CV proc for preliminary results.   Getsemani Lindon, RVT 03/15/2024, 2:56 PM

## 2024-03-15 NOTE — Progress Notes (Signed)
Transported to MRI via wheelchair.

## 2024-03-16 LAB — LUPUS ANTICOAGULANT PANEL
DRVVT: 44.4 s (ref 0.0–47.0)
PTT Lupus Anticoagulant: 36.3 s (ref 0.0–43.5)

## 2024-03-16 LAB — BETA-2-GLYCOPROTEIN I ABS, IGG/M/A
Beta-2 Glyco I IgG: <9 GPI IgG units (ref 0–20)
Beta-2-Glycoprotein I IgA: <9 GPI IgA units (ref 0–25)
Beta-2-Glycoprotein I IgM: <9 GPI IgM units (ref 0–32)

## 2024-03-16 LAB — CARDIOLIPIN ANTIBODIES, IGG, IGM, IGA

## 2024-03-16 LAB — HOMOCYSTEINE: Homocysteine: 13.5 umol/L (ref 0.0–14.5)

## 2024-03-16 LAB — ANA W/REFLEX IF POSITIVE: Anti Nuclear Antibody (ANA): NEGATIVE

## 2024-03-17 DIAGNOSIS — F4321 Adjustment disorder with depressed mood: Secondary | ICD-10-CM | POA: Diagnosis not present

## 2024-03-17 LAB — CARDIOLIPIN ANTIBODIES, IGG, IGM, IGA
Anticardiolipin IgA: <9 U/mL (ref 0–11)
Anticardiolipin IgG: <9 GPL U/mL (ref 0–14)
Anticardiolipin IgM: 31 [MPL'U]/mL — ABNORMAL HIGH (ref 0–12)

## 2024-03-23 DIAGNOSIS — R899 Unspecified abnormal finding in specimens from other organs, systems and tissues: Secondary | ICD-10-CM | POA: Diagnosis not present

## 2024-03-23 DIAGNOSIS — Z8673 Personal history of transient ischemic attack (TIA), and cerebral infarction without residual deficits: Secondary | ICD-10-CM | POA: Diagnosis not present

## 2024-03-23 DIAGNOSIS — Z6832 Body mass index (BMI) 32.0-32.9, adult: Secondary | ICD-10-CM | POA: Diagnosis not present

## 2024-04-20 ENCOUNTER — Encounter: Payer: Self-pay | Admitting: Adult Health

## 2024-04-20 ENCOUNTER — Ambulatory Visit: Admitting: Adult Health

## 2024-04-20 VITALS — BP 106/69 | HR 66 | Ht 64.0 in | Wt 196.8 lb

## 2024-04-20 DIAGNOSIS — G459 Transient cerebral ischemic attack, unspecified: Secondary | ICD-10-CM

## 2024-04-20 DIAGNOSIS — G43109 Migraine with aura, not intractable, without status migrainosus: Secondary | ICD-10-CM

## 2024-04-20 NOTE — Progress Notes (Signed)
 PATIENT: Wendy Spencer DOB: 1976-03-22  REASON FOR VISIT: follow up HISTORY FROM: patient PRIMARY NEUROLOGIST: Dr. Janett Medin  Chief Complaint  Patient presents with   Hospitalization Follow-up    Rm 20.  Alone.  No therapy's.  Back to baseline.  ? Tia vs migraine.     HISTORY OF PRESENT ILLNESS: Today 04/20/24   Wendy Spencer is a 48 y.o. female here for hospital follow-up after complicated migraine with visual aura versus TIA/ amauraosis fugax. returns today for follow-up.  Patient states that she is back to her baseline.  Denies any additional visual changes.  Denies headaches.  Denies any strokelike symptoms.  Cholesterol is currently managed by diet.  Blood work in the hospital showed abnormality with the cardiolipin antibodies-reports that PCP has made a referral to hematology.  She already has a hematologist due to her hemochromatosis and reports that she has called to schedule an appointment.  Remains on aspirin .  Returns today for an evaluation.    HISTORY [name] is a 48 y.o. female with hx of hemochromatosis, SLE, OSA, who presents with episode of vision loss in L eye lasting about 15 mins with spontaneous resolution.MRI negative. Admitted for TIA workup.  NIH on Admission: 0   Most likely complicated migraine with visual aura, less likely amaurosis fugax or TIA Pt stated that she had episode of mild HA with left eye blurry vision due to grey shape in the center field accompanied with bottom halo and flushing, lasted about . And HA resolved about .  Hx of migraine x 2 in college. No recent migraine episode. CT head CTH was negative for acute finding CTA head & neck: No LVO MRI: No acute abnormalities.  2D Echo: LVEF 60 to 65% VAS US  LE DVT: Negative TCD w/ Bubble Study: Negative for significant PFO Carotid ultrasound 2023: No hemodynamically significant stenosis Hypercoagulable Panel: Homocysteine, ANA Cardiolipin, Beta-2 ,  Lupus anticoagulant PENDING. Will need to follow-up with PCP for results, patient aware.  LDL 75 HgbA1c 5.0 UDS negative VTE prophylaxis - loenox No antithrombotic prior to admission, continue aspirin  81 mg daily and clopidogrel  75 mg daily for 3 weeks and then aspirin  alone. Therapy recommendations:  No PT/OT notes, Patient ambulating in room independently Disposition:  home today   Lupus Follow-up with rheumatologist Dr. Ziolkowska at [facility] Currently on Plaquenil  CRP, ESR WNL, ANA pending Previous tests with C3, C4, dsDNA negative twice Continue follow-up with rheumatology as outpatient   Lipid management Home meds:  none LDL 75, goal < 100 Continue diet control    Other Stroke Risk Factors Obesity, Body mass index is 32.61 kg/m., BMI >/= 30 associated with increased stroke risk, recommend weight loss, diet and exercise as appropriate  Family hx stroke (Father (TIA), Maternal Grandmother (TIA), Paternal Grandfather) Obstructive sleep apnea, on CPAP at home Former smoker    REVIEW OF SYSTEMS: Out of a complete 14 system review of symptoms, the patient complains only of the following symptoms, and all other reviewed systems are negative.  ALLERGIES: Allergies  Allergen Reactions   Fluogen [Influenza Virus Vaccine] Shortness Of Breath and Rash   Haemophilus B Polysaccharide Vaccine Rash and Shortness Of Breath   Macrobid [Nitrofurantoin] Shortness Of Breath   Phenazopyridine Hcl Shortness Of Breath   Pyridium [Phenazopyridine] Shortness Of Breath   Latex Dermatitis    Other reaction(s): Contact Dermatitis (intolerance)   Naltrexone Hives   Avocado Other (See Comments)   Banana    Duloxetine Hcl  Other (See Comments)    Headaches   Grape (Artificial) Flavoring Agent (Non-Screening)     Grape fruit    Kiwi Extract Other (See Comments)   Pylera [Bis Subcit-Metronid-Tetracyc] Itching    HOME MEDICATIONS: Outpatient Medications Prior to Visit  Medication Sig  Dispense Refill   acetaminophen  (TYLENOL ) 325 MG tablet Take 2 tablets (650 mg total) by mouth every 6 (six) hours as needed for mild pain (pain score 1-3).     ALPRAZolam  (XANAX ) 0.25 MG tablet Take 0.25 mg by mouth 3 (three) times daily as needed for anxiety.     aspirin  EC 81 MG tablet Take 1 tablet (81 mg total) by mouth daily. Swallow whole. 90 tablet 0   FLUoxetine  (PROZAC ) 10 MG capsule Take 10 mg by mouth daily.     hydroxychloroquine  (PLAQUENIL ) 200 MG tablet Take 400 mg by mouth daily.     ondansetron  (ZOFRAN -ODT) 4 MG disintegrating tablet Take 4 mg by mouth every 8 (eight) hours as needed for nausea or vomiting.     pantoprazole  (PROTONIX ) 40 MG tablet Take 1 tablet by mouth daily.     Vitamin D , Ergocalciferol , (DRISDOL ) 1.25 MG (50000 UNIT) CAPS capsule Take 50,000 Units by mouth once a week.     valACYclovir  (VALTREX ) 1000 MG tablet Take 2,000 mg by mouth every 12 (twelve) hours as needed (Fever blisters). (Patient not taking: Reported on 04/20/2024)     No facility-administered medications prior to visit.    PAST MEDICAL HISTORY: Past Medical History:  Diagnosis Date   Achilles tendinitis of left lower extremity 04/22/2017   Allergic dermatitis 08/05/2021   Last Assessment & Plan: Formatting of this note might be different from the original. Symptoms consistent with allergic dermatitis Take steriods as prescribed Stay out of the sun/heat  worsening of current symptoms, present to the ER for further evaluation and treatment. Patient verbalized understanding and agreed with this plan of care.   Anemia    Anxiety    Depression    Fibromyalgia    GERD (gastroesophageal reflux disease)    Hereditary hemochromatosis (HCC)    Insomnia    Lupus    Obstructive sleep apnea on CPAP 07/18/2016   Rosacea    Systemic lupus erythematosus (HCC)    Vitamin D  deficiency     PAST SURGICAL HISTORY: Past Surgical History:  Procedure Laterality Date   CHOLECYSTECTOMY  2008   DILATION  AND CURETTAGE OF UTERUS  1994   LAPAROSCOPIC GASTRIC SLEEVE RESECTION  2017    FAMILY HISTORY: Family History  Problem Relation Age of Onset   Hypertension Mother    Heart murmur Mother    Celiac disease Mother    Skin cancer Mother    CAD Mother    Hypertension Father    Heart attack Father    Transient ischemic attack Father    Thyroid disease Father    Heart murmur Father    Thyroid disease Sister    Hypertension Brother    Transient ischemic attack Maternal Grandmother    Heart disease Maternal Grandfather        Enlarged heart   CVA Paternal Grandmother    Hypertension Paternal Grandmother    Heart attack Paternal Grandfather    Hypertension Paternal Grandfather     SOCIAL HISTORY: Social History   Socioeconomic History   Marital status: Single    Spouse name: Not on file   Number of children: 4   Years of education: Not on file   Highest education  level: Not on file  Occupational History   Not on file  Tobacco Use   Smoking status: Former    Current packs/day: 0.00    Types: Cigarettes    Quit date: 2000    Years since quitting: 25.3   Smokeless tobacco: Not on file  Vaping Use   Vaping status: Never Used  Substance and Sexual Activity   Alcohol use: Yes    Comment: socially   Drug use: Never   Sexual activity: Yes    Birth control/protection: I.U.D.  Other Topics Concern   Not on file  Social History Narrative   Not on file   Social Drivers of Health   Financial Resource Strain: Not on file  Food Insecurity: No Food Insecurity (03/15/2024)   Hunger Vital Sign    Worried About Running Out of Food in the Last Year: Never true    Ran Out of Food in the Last Year: Never true  Transportation Needs: No Transportation Needs (03/15/2024)   PRAPARE - Administrator, Civil Service (Medical): No    Lack of Transportation (Non-Medical): No  Physical Activity: Not on file  Stress: Not on file  Social Connections: Not on file  Intimate  Partner Violence: Not At Risk (03/15/2024)   Humiliation, Afraid, Rape, and Kick questionnaire    Fear of Current or Ex-Partner: No    Emotionally Abused: No    Physically Abused: No    Sexually Abused: No      PHYSICAL EXAM  Vitals:   04/20/24 0940  BP: 106/69  Pulse: 66  Weight: 196 lb 12.8 oz (89.3 kg)  Height: 5\' 4"  (1.626 m)   Body mass index is 33.78 kg/m.  Generalized: Well developed, in no acute distress   Neurological examination  Mentation: Alert oriented to time, place, history taking. Follows all commands speech and language fluent Cranial nerve II-XII: Pupils were equal round reactive to light. Extraocular movements were full, visual field were full on confrontational test. Facial sensation and strength were normal. Uvula tongue midline. Head turning and shoulder shrug  were normal and symmetric. Motor: The motor testing reveals 5 over 5 strength of all 4 extremities. Good symmetric motor tone is noted throughout.  Sensory: Sensory testing is intact to soft touch on all 4 extremities. No evidence of extinction is noted.  Coordination: Cerebellar testing reveals good finger-nose-finger and heel-to-shin bilaterally.  Gait and station: Gait is normal. T  Reflexes: Deep tendon reflexes are symmetric and normal bilaterally.   DIAGNOSTIC DATA (LABS, IMAGING, TESTING) - I reviewed patient records, labs, notes, testing and imaging myself where available.  Lab Results  Component Value Date   WBC 8.0 03/14/2024   HGB 13.0 03/14/2024   HCT 37.5 03/14/2024   MCV 92.6 03/14/2024   PLT 275 03/14/2024      Component Value Date/Time   NA 136 03/14/2024 1802   NA 136 (A) 05/08/2021 0000   K 3.6 03/14/2024 1802   CL 103 03/14/2024 1802   CO2 24 03/14/2024 1802   GLUCOSE 102 (H) 03/14/2024 1802   BUN 15 03/14/2024 1802   BUN 16 05/08/2021 0000   CREATININE 0.76 03/14/2024 1802   CREATININE 0.84 07/26/2023 1424   CALCIUM  8.9 03/14/2024 1802   PROT 7.0 07/26/2023 1424    ALBUMIN 3.7 07/26/2023 1424   AST 14 (L) 07/26/2023 1424   ALT 12 07/26/2023 1424   ALKPHOS 36 (L) 07/26/2023 1424   BILITOT 0.3 07/26/2023 1424   GFRNONAA >60 03/14/2024  1802   GFRNONAA >60 07/26/2023 1424   Lab Results  Component Value Date   CHOL 147 03/15/2024   HDL 62 03/15/2024   LDLCALC 75 03/15/2024   TRIG 48 03/15/2024   CHOLHDL 2.4 03/15/2024   Lab Results  Component Value Date   HGBA1C 5.0 03/15/2024   No results found for: "VITAMINB12" No results found for: "TSH"    ASSESSMENT AND PLAN 48 y.o. year old female  has a past medical history of Achilles tendinitis of left lower extremity (04/22/2017), Allergic dermatitis (08/05/2021), Anemia, Anxiety, Depression, Fibromyalgia, GERD (gastroesophageal reflux disease), Hereditary hemochromatosis (HCC), Insomnia, Lupus, Obstructive sleep apnea on CPAP (07/18/2016), Rosacea, Systemic lupus erythematosus (HCC), and Vitamin D  deficiency. here with:  Complicated migraine versus TIA   Continue aspirin  81 mg daily   for secondary stroke prevention.   Discussed secondary stroke prevention measures and importance of close PCP follow up for aggressive stroke risk factor management. I have gone over the pathophysiology of stroke, warning signs and symptoms, risk factors and their management in some detail with instructions to go to the closest emergency room for symptoms of concern. HTN: BP goal <130/90.   HLD: LDL goal <70. Recent LDL 75.  DMII: A1c goal<7.0. Recent A1c 5.  Encouraged patient to monitor diet and encouraged exercise FU with our office PRN      Clem Currier, MSN, NP-C 04/20/2024, 9:40 AM [facility] [address], [address] [place], [place] [address] [phone]

## 2024-04-20 NOTE — Patient Instructions (Signed)
Your Plan:  Continue Aspirin 81 mg daily   Blood pressure goal <130/90 Cholesterol LDL goal <70 Diabetes goal A1c <7 Monitor diet and try to exercise   Thank you for coming to see [place] at [facility]. I hope we have been able to provide you high quality care today.  You may receive a patient satisfaction survey over the next few weeks. We would appreciate your feedback and comments so that we may continue to improve ourselves and the health of our patients.

## 2024-04-21 NOTE — Progress Notes (Signed)
 I agree with the above plan

## 2024-04-29 ENCOUNTER — Inpatient Hospital Stay: Attending: Oncology | Admitting: Oncology

## 2024-04-29 ENCOUNTER — Other Ambulatory Visit: Payer: Self-pay

## 2024-04-29 ENCOUNTER — Other Ambulatory Visit: Payer: Self-pay | Admitting: Oncology

## 2024-04-29 VITALS — BP 109/68 | HR 70 | Temp 98.7°F | Resp 18 | Ht 64.0 in | Wt 193.8 lb

## 2024-04-29 DIAGNOSIS — R760 Raised antibody titer: Secondary | ICD-10-CM | POA: Diagnosis not present

## 2024-04-29 DIAGNOSIS — D805 Immunodeficiency with increased immunoglobulin M [IgM]: Secondary | ICD-10-CM | POA: Insufficient documentation

## 2024-04-29 NOTE — Progress Notes (Signed)
 [facility]  [address] [place],  [place]  [address] [phone]  Clinic Day:  04/29/2024  Referring physician: Olan Bering, MD  HISTORY OF PRESENT ILLNESS:  The patient is a 48 y.o. 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  However, she comes in today for a completely different reason.  Labs in late March 2025 showed an elevated IgM anticardiolipin antibody level of 31.  According to the patient, she had a 20-minute episode of absent vision in the central portion of her left eye field.  She claim to have not done anything that could have precipitated this acute event.  She also claims that her left eye visual disturbance abruptly returned to normal without any type of intervention.  However, because of this episode, the patient went to the emergency room and was ultimately admitted for extensive testing.  A brain MRI was done, which did not reveal any CNS abnormalities.  She has also been followed by [facility].  She was initially placed on Plavix  for 3 weeks before being switched to aspirin  81 mg, which she will take indefinitely.  Overall, they believe she may have had a complicated migraine more so than a TIA.  Nevertheless, her positive anticardiolipin antibody study has her in today to determine its significance.  The patient denies ever having any symptoms like this before.  She also denies having a history of either venous or arterial blood clots.  PHYSICAL EXAM:  Blood pressure 109/68, pulse 70, temperature 98.7 F (37.1 C), temperature source Oral, resp. rate 18, height 5\' 4"  (1.626 m), weight 193 lb 12.8 oz (87.9 kg), SpO2 99%. Wt Readings from Last 3 Encounters:  04/29/24 193 lb 12.8 oz (87.9 kg)  04/20/24 196 lb 12.8 oz (89.3 kg)  03/14/24 190 lb (86.2 kg)   Body mass index is 33.27 kg/m.   Performance status (ECOG): 0 Physical Exam Constitutional:      Appearance: Normal appearance. She is not ill-appearing.  HENT:      Mouth/Throat:     Mouth: Mucous membranes are moist.     Pharynx: Oropharynx is clear. No oropharyngeal exudate or posterior oropharyngeal erythema.  Cardiovascular:     Rate and Rhythm: Normal rate and regular rhythm.     Heart sounds: No murmur heard.    No friction rub. No gallop.  Pulmonary:     Effort: Pulmonary effort is normal. No respiratory distress.     Breath sounds: Normal breath sounds. No wheezing, rhonchi or rales.  Abdominal:     General: Bowel sounds are normal. There is no distension.     Palpations: Abdomen is soft. There is no mass.     Tenderness: There is no abdominal tenderness.  Musculoskeletal:        General: No swelling.     Right lower leg: No edema.     Left lower leg: No edema.  Lymphadenopathy:     Cervical: No cervical adenopathy.     Upper Body:     Right upper body: No supraclavicular or axillary adenopathy.     Left upper body: No supraclavicular or axillary adenopathy.     Lower Body: No right inguinal adenopathy. No left inguinal adenopathy.  Skin:    General: Skin is warm.     Coloration: Skin is not jaundiced.     Findings: No lesion or rash.  Neurological:     General: No focal deficit present.     Mental Status: She is alert and oriented to person,  place, and time. Mental status is at baseline.  Psychiatric:        Mood and Affect: Mood normal.        Behavior: Behavior normal.        Thought Content: Thought content normal.    LABS:      Latest Ref Rng & Units 03/14/2024    6:02 PM 07/26/2023    2:24 PM 11/16/2022    1:52 PM  CBC  WBC 4.0 - 10.5 K/uL 8.0  8.0  7.3   Hemoglobin 12.0 - 15.0 g/dL 40.9  81.1  91.4   Hematocrit 36.0 - 46.0 % 37.5  36.3  38.1   Platelets 150 - 400 K/uL 275  324  308       Latest Ref Rng & Units 03/14/2024    6:02 PM 07/26/2023    2:24 PM 11/16/2022    1:52 PM  CMP  Glucose 70 - 99 mg/dL 782  99  97   BUN 6 - 20 mg/dL 15  20  17    Creatinine 0.44 - 1.00 mg/dL 9.56  2.13  0.86   Sodium 135 - 145  mmol/L 136  136  140   Potassium 3.5 - 5.1 mmol/L 3.6  3.8  3.8   Chloride 98 - 111 mmol/L 103  104  108   CO2 22 - 32 mmol/L 24  23  25    Calcium  8.9 - 10.3 mg/dL 8.9  8.6  8.5   Total Protein 6.5 - 8.1 g/dL  7.0  6.7   Total Bilirubin 0.3 - 1.2 mg/dL  0.3  0.5   Alkaline Phos 38 - 126 U/L  36  33   AST 15 - 41 U/L  14  21   ALT 0 - 44 U/L  12  20     Latest Reference Range & Units 03/15/24 08:58  Anticardiolipin Ab,IgA,Qn 0 - 11 APL U/mL <9  Anticardiolipin Ab,IgG,Qn 0 - 14 GPL U/mL <9  Anticardiolipin Ab,IgM,Qn 0 - 12 MPL U/mL 31 (H)  (H): Data is abnormally high  ASSESSMENT & PLAN:  A 48 year old woman with hemochromatosis (C282Y/H63D).  However, she comes in due to having an elevated IgM anticardiolipin antibody in March 2025.  In clinic today, I explained to the patient that this test is usually done when evaluating someone for potential antiphospholipid syndrome.  This is a hypercoagulable disorder where the immune system interacts with protein/antigens in the bloodstream; when this interaction occurs, it leads to a cascade of events that ends up with a blood clot forming.  The 3 tests used to diagnose antiphospholipid syndrome include beta-2  glycoprotein antibodies, a lupus anticoagulant screen, and anticardiolipin antibodies.  Of note, her beta-2  glycoprotein antibodies and lupus anticoagulant screen both came back negative.  However, in order for the diagnosis of antiphospholipid syndrome to be met, one of the 3 aforementioned tests needs to be positive on 2 separate occasions, at least 3 months apart from 1 another.  Based upon this, I will recheck her anticardiolipin antibody levels in late June 2025.  The only issue that concerns me is that I am not completely convinced she has had some type of clotting episode to where testing for antiphospholipid syndrome appears warranted.  Even if her IgM anticardiolipin antibody comes back elevated in June 2025, I am not sure if that test should  really lead to her needing lifelong anticoagulation with either warfarin or a DOAC when an indisputable  blood clot has not been confirmed.  Nevertheless, will see her back in early July 2025 to review her anticardiolipin antibody results.  As she also has hemochromatosis, her iron studies will also be checked around that same time.  The patient understands all the plans discussed today and is in agreement with them.  Sharda Keddy Felicia Horde, MD

## 2024-05-08 ENCOUNTER — Ambulatory Visit: Admitting: Oncology

## 2024-05-12 DIAGNOSIS — F4321 Adjustment disorder with depressed mood: Secondary | ICD-10-CM | POA: Diagnosis not present

## 2024-06-09 DIAGNOSIS — F4321 Adjustment disorder with depressed mood: Secondary | ICD-10-CM | POA: Diagnosis not present

## 2024-06-15 ENCOUNTER — Inpatient Hospital Stay: Attending: Oncology

## 2024-06-15 LAB — CBC WITH DIFFERENTIAL (CANCER CENTER ONLY)
Abs Immature Granulocytes: 0.01 10*3/uL (ref 0.00–0.07)
Basophils Absolute: 0 10*3/uL (ref 0.0–0.1)
Basophils Relative: 1 %
Eosinophils Absolute: 0.2 10*3/uL (ref 0.0–0.5)
Eosinophils Relative: 3 %
HCT: 38.2 % (ref 36.0–46.0)
Hemoglobin: 12.8 g/dL (ref 12.0–15.0)
Immature Granulocytes: 0 %
Lymphocytes Relative: 36 %
Lymphs Abs: 1.7 10*3/uL (ref 0.7–4.0)
MCH: 31.4 pg (ref 26.0–34.0)
MCHC: 33.5 g/dL (ref 30.0–36.0)
MCV: 93.9 fL (ref 80.0–100.0)
Monocytes Absolute: 0.6 10*3/uL (ref 0.1–1.0)
Monocytes Relative: 12 %
Neutro Abs: 2.3 10*3/uL (ref 1.7–7.7)
Neutrophils Relative %: 48 %
Platelet Count: 267 10*3/uL (ref 150–400)
RBC: 4.07 MIL/uL (ref 3.87–5.11)
RDW: 13.2 % (ref 11.5–15.5)
WBC Count: 4.7 10*3/uL (ref 4.0–10.5)
nRBC: 0 % (ref 0.0–0.2)

## 2024-06-15 LAB — IRON AND TIBC
Iron: 165 ug/dL (ref 28–170)
Saturation Ratios: 52 % — ABNORMAL HIGH (ref 10.4–31.8)
TIBC: 315 ug/dL (ref 250–450)
UIBC: 150 ug/dL

## 2024-06-15 LAB — CMP (CANCER CENTER ONLY)
ALT: 13 U/L (ref 0–44)
AST: 19 U/L (ref 15–41)
Albumin: 4 g/dL (ref 3.5–5.0)
Alkaline Phosphatase: 47 U/L (ref 38–126)
Anion gap: 9 (ref 5–15)
BUN: 17 mg/dL (ref 6–20)
CO2: 27 mmol/L (ref 22–32)
Calcium: 9.1 mg/dL (ref 8.9–10.3)
Chloride: 102 mmol/L (ref 98–111)
Creatinine: 0.88 mg/dL (ref 0.44–1.00)
GFR, Estimated: >60 mL/min (ref >60)
Glucose, Bld: 89 mg/dL (ref 70–99)
Potassium: 3.9 mmol/L (ref 3.5–5.1)
Sodium: 138 mmol/L (ref 135–145)
Total Bilirubin: 0.4 mg/dL (ref 0.0–1.2)
Total Protein: 6.7 g/dL (ref 6.5–8.1)

## 2024-06-15 LAB — FERRITIN: Ferritin: 9 ng/mL — ABNORMAL LOW (ref 11–307)

## 2024-06-16 LAB — CARDIOLIPIN ANTIBODIES, IGG, IGM, IGA
Anticardiolipin IgA: <9 U/mL (ref 0–11)
Anticardiolipin IgG: <9 GPL U/mL (ref 0–14)
Anticardiolipin IgM: 27 [MPL'U]/mL — ABNORMAL HIGH (ref 0–12)

## 2024-06-20 NOTE — Progress Notes (Unsigned)
 [facility]  [address] [place],  [place]  [address] [phone]  Clinic Day:  06/22/2024  Referring physician: Aleck Milch, MD  HISTORY OF PRESENT ILLNESS:  The patient is a 48 y.o. 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  This patient also recently had a mildly positive IgM anticardiolipin antibody level of 31.  Of note, this test was ordered after she had a 20-minute episode of absent vision in the central portion of her left eye field.  This abnormal test raised the concern about her potentially having antiphospholipid syndrome.  She comes in today to reassess her anticardiolipin antibody levels.  Of note, her beta-2  glycoprotein antibodies and lupus anticoagulant screen, the other 2 tests used to check for antiphospholipid syndrome, came back negative 3 months ago.  Since her last visit, the patient has been doing well.  She denies having any acute visual changes since that one and only episode in March 2025.  She does not believe her other physicians think she had some type of embolic event that led to the brief, acute vision change in her left eye.  To date, she denies having any type of blood clots.  PHYSICAL EXAM:  Blood pressure 102/72, pulse 68, temperature 98.7 F (37.1 C), temperature source Oral, resp. rate 18, height 5' 4 (1.626 m), weight 194 lb 8 oz (88.2 kg), SpO2 100%. Wt Readings from Last 3 Encounters:  06/22/24 194 lb 8 oz (88.2 kg)  04/29/24 193 lb 12.8 oz (87.9 kg)  04/20/24 196 lb 12.8 oz (89.3 kg)   Body mass index is 33.39 kg/m.   Performance status (ECOG): 0 Physical Exam Constitutional:      Appearance: Normal appearance. She is not ill-appearing.  HENT:     Mouth/Throat:     Mouth: Mucous membranes are moist.     Pharynx: Oropharynx is clear. No oropharyngeal exudate or posterior oropharyngeal erythema.  Cardiovascular:     Rate and Rhythm: Normal rate and regular rhythm.     Heart sounds: No murmur heard.     No friction rub. No gallop.  Pulmonary:     Effort: Pulmonary effort is normal. No respiratory distress.     Breath sounds: Normal breath sounds. No wheezing, rhonchi or rales.  Abdominal:     General: Bowel sounds are normal. There is no distension.     Palpations: Abdomen is soft. There is no mass.     Tenderness: There is no abdominal tenderness.  Musculoskeletal:        General: No swelling.     Right lower leg: No edema.     Left lower leg: No edema.  Lymphadenopathy:     Cervical: No cervical adenopathy.     Upper Body:     Right upper body: No supraclavicular or axillary adenopathy.     Left upper body: No supraclavicular or axillary adenopathy.     Lower Body: No right inguinal adenopathy. No left inguinal adenopathy.  Skin:    General: Skin is warm.     Coloration: Skin is not jaundiced.     Findings: No lesion or rash.  Neurological:     General: No focal deficit present.     Mental Status: She is alert and oriented to person, place, and time. Mental status is at baseline.  Psychiatric:        Mood and Affect: Mood normal.        Behavior: Behavior normal.        Thought Content: Thought  content normal.    LABS:      Latest Ref Rng & Units 06/15/2024    9:25 AM 03/14/2024    6:02 PM 07/26/2023    2:24 PM  CBC  WBC 4.0 - 10.5 K/uL 4.7  8.0  8.0   Hemoglobin 12.0 - 15.0 g/dL 87.1  86.9  88.1   Hematocrit 36.0 - 46.0 % 38.2  37.5  36.3   Platelets 150 - 400 K/uL 267  275  324       Latest Ref Rng & Units 06/15/2024    9:25 AM 03/14/2024    6:02 PM 07/26/2023    2:24 PM  CMP  Glucose 70 - 99 mg/dL 89  897  99   BUN 6 - 20 mg/dL 17  15  20    Creatinine 0.44 - 1.00 mg/dL 9.11  9.23  9.15   Sodium 135 - 145 mmol/L 138  136  136   Potassium 3.5 - 5.1 mmol/L 3.9  3.6  3.8   Chloride 98 - 111 mmol/L 102  103  104   CO2 22 - 32 mmol/L 27  24  23    Calcium  8.9 - 10.3 mg/dL 9.1  8.9  8.6   Total Protein 6.5 - 8.1 g/dL 6.7   7.0   Total Bilirubin 0.0 - 1.2 mg/dL 0.4    0.3   Alkaline Phos 38 - 126 U/L 47   36   AST 15 - 41 U/L 19   14   ALT 0 - 44 U/L 13   12     Latest Reference Range & Units 06/15/24 09:25  Iron 28 - 170 ug/dL 834  UIBC ug/dL 849  TIBC 749 - 549 ug/dL 684  Saturation Ratios 10.4 - 31.8 % 52 (H)  Ferritin 11 - 307 ng/mL 9 (L)  (H): Data is abnormally high (L): Data is abnormally low  Latest Reference Range & Units 03/15/24 08:58 06/15/24 09:25  Anticardiolipin Ab,IgA,Qn 0 - 11 APL U/mL <9 <9  Anticardiolipin Ab,IgG,Qn 0 - 14 GPL U/mL <9 <9  Anticardiolipin Ab,IgM,Qn 0 - 12 MPL U/mL 31 (H) 27 (H)  (H): Data is abnormally high  ASSESSMENT & PLAN:  A 48 year old woman with hemochromatosis (C282Y/H63D).  However, she comes in today to reassess her IgM anticardiolipin antibody level to see if it is still elevated after 3 months and whether this signifies the possibility of having antiphospholipid syndrome.  Once again, her IgM anticardiolipin antibody came back mildly elevated, but lower than before.  However, for someone with antiphospholipid syndrome, I would expect such levels to be extremely elevated.  Furthermore, testing for antiphospholipid syndrome is done when there has been definitive evidence of a blood clot being present.  As mentioned previously, it does not appear that the acute loss of vision in her left eye has been qualified as being an embolic event.  When putting all of this information together, I do not consider her to have antiphospholipid syndrome.  Moving forward, her hemochromatosis will be all that I follow.  When evaluating her most recent iron parameters, her ferritin is very low to where a maintenance phlebotomy is not necessary at this time.  I will see her back in 6 months to reassess her iron parameters, as it pertains to her underlying hemochromatosis.  The patient understands all the plans discussed today and is in agreement with them.  Gerturde Kuba DELENA Kerns, MD

## 2024-06-22 ENCOUNTER — Other Ambulatory Visit: Payer: Self-pay | Admitting: Oncology

## 2024-06-22 ENCOUNTER — Telehealth: Payer: Self-pay | Admitting: Oncology

## 2024-06-22 ENCOUNTER — Inpatient Hospital Stay: Attending: Oncology | Admitting: Oncology

## 2024-06-22 ENCOUNTER — Ambulatory Visit: Admitting: Oncology

## 2024-06-22 ENCOUNTER — Other Ambulatory Visit: Payer: Self-pay

## 2024-06-22 DIAGNOSIS — R768 Other specified abnormal immunological findings in serum: Secondary | ICD-10-CM | POA: Insufficient documentation

## 2024-06-22 NOTE — Telephone Encounter (Signed)
 Patient has been scheduled for follow-up visit per 06/22/24 LOS.  Pt given an appt calendar with date and time.

## 2024-06-30 DIAGNOSIS — F4321 Adjustment disorder with depressed mood: Secondary | ICD-10-CM | POA: Diagnosis not present

## 2024-07-31 ENCOUNTER — Other Ambulatory Visit: Payer: Self-pay | Admitting: Medical Genetics

## 2024-08-11 DIAGNOSIS — R21 Rash and other nonspecific skin eruption: Secondary | ICD-10-CM | POA: Diagnosis not present

## 2024-08-11 DIAGNOSIS — W57XXXA Bitten or stung by nonvenomous insect and other nonvenomous arthropods, initial encounter: Secondary | ICD-10-CM | POA: Diagnosis not present

## 2024-08-18 ENCOUNTER — Encounter: Payer: Self-pay | Admitting: Oncology

## 2024-09-14 DIAGNOSIS — B001 Herpesviral vesicular dermatitis: Secondary | ICD-10-CM | POA: Diagnosis not present

## 2024-09-14 DIAGNOSIS — H6993 Unspecified Eustachian tube disorder, bilateral: Secondary | ICD-10-CM | POA: Diagnosis not present

## 2024-09-14 DIAGNOSIS — M329 Systemic lupus erythematosus, unspecified: Secondary | ICD-10-CM | POA: Diagnosis not present

## 2024-09-14 DIAGNOSIS — K5900 Constipation, unspecified: Secondary | ICD-10-CM | POA: Diagnosis not present

## 2024-09-16 DIAGNOSIS — D485 Neoplasm of uncertain behavior of skin: Secondary | ICD-10-CM | POA: Diagnosis not present

## 2024-09-16 DIAGNOSIS — L719 Rosacea, unspecified: Secondary | ICD-10-CM | POA: Diagnosis not present

## 2024-09-16 DIAGNOSIS — L209 Atopic dermatitis, unspecified: Secondary | ICD-10-CM | POA: Diagnosis not present

## 2024-10-07 ENCOUNTER — Other Ambulatory Visit: Payer: Self-pay | Admitting: Medical Genetics

## 2024-10-07 DIAGNOSIS — Z006 Encounter for examination for normal comparison and control in clinical research program: Secondary | ICD-10-CM

## 2024-12-15 ENCOUNTER — Encounter: Payer: Self-pay | Admitting: Oncology

## 2024-12-21 ENCOUNTER — Inpatient Hospital Stay: Attending: Oncology

## 2024-12-21 ENCOUNTER — Telehealth: Payer: Self-pay | Admitting: Oncology

## 2024-12-21 NOTE — Telephone Encounter (Signed)
 Contacted pt to reschedule missed lab appt from today. Unable to reach via phone.

## 2024-12-23 ENCOUNTER — Inpatient Hospital Stay: Admitting: Oncology
